# Patient Record
Sex: Female | Born: 1980 | Race: Black or African American | Hispanic: No | Marital: Married | Smoking: Never smoker
Health system: Southern US, Community
[De-identification: ages and names within clinical notes are randomized; demographics above are authoritative.]

## PROBLEM LIST (undated history)

## (undated) DIAGNOSIS — C55 Malignant neoplasm of uterus, part unspecified: Secondary | ICD-10-CM

## (undated) DIAGNOSIS — Z98811 Dental restoration status: Secondary | ICD-10-CM

## (undated) DIAGNOSIS — Z803 Family history of malignant neoplasm of breast: Secondary | ICD-10-CM

## (undated) DIAGNOSIS — S86019A Strain of unspecified Achilles tendon, initial encounter: Secondary | ICD-10-CM

## (undated) DIAGNOSIS — Z8042 Family history of malignant neoplasm of prostate: Secondary | ICD-10-CM

## (undated) HISTORY — DX: Family history of malignant neoplasm of prostate: Z80.42

## (undated) HISTORY — DX: Family history of malignant neoplasm of breast: Z80.3

## (undated) HISTORY — PX: LEEP: SHX91

## (undated) HISTORY — PX: TONSILLECTOMY AND ADENOIDECTOMY: SHX28

## (undated) HISTORY — PX: UNILATERAL SALPINGECTOMY: SHX6160

---

## 2004-06-04 ENCOUNTER — Emergency Department (HOSPITAL_COMMUNITY): Admission: EM | Admit: 2004-06-04 | Discharge: 2004-06-05 | Payer: Self-pay | Admitting: Emergency Medicine

## 2004-06-15 ENCOUNTER — Emergency Department (HOSPITAL_COMMUNITY): Admission: EM | Admit: 2004-06-15 | Discharge: 2004-06-15 | Payer: Self-pay | Admitting: Emergency Medicine

## 2004-10-22 ENCOUNTER — Emergency Department (HOSPITAL_COMMUNITY): Admission: EM | Admit: 2004-10-22 | Discharge: 2004-10-22 | Payer: Self-pay | Admitting: Emergency Medicine

## 2005-01-28 ENCOUNTER — Emergency Department (HOSPITAL_COMMUNITY): Admission: EM | Admit: 2005-01-28 | Discharge: 2005-01-29 | Payer: Self-pay | Admitting: Emergency Medicine

## 2005-03-25 ENCOUNTER — Other Ambulatory Visit: Admission: RE | Admit: 2005-03-25 | Discharge: 2005-03-25 | Payer: Self-pay | Admitting: Obstetrics and Gynecology

## 2005-04-12 ENCOUNTER — Ambulatory Visit (HOSPITAL_COMMUNITY): Admission: RE | Admit: 2005-04-12 | Discharge: 2005-04-12 | Payer: Self-pay | Admitting: Obstetrics and Gynecology

## 2005-04-12 ENCOUNTER — Emergency Department (HOSPITAL_COMMUNITY): Admission: EM | Admit: 2005-04-12 | Discharge: 2005-04-12 | Payer: Self-pay | Admitting: Emergency Medicine

## 2005-04-14 ENCOUNTER — Inpatient Hospital Stay (HOSPITAL_COMMUNITY): Admission: AD | Admit: 2005-04-14 | Discharge: 2005-04-14 | Payer: Self-pay | Admitting: Obstetrics and Gynecology

## 2005-04-15 ENCOUNTER — Ambulatory Visit (HOSPITAL_COMMUNITY): Admission: RE | Admit: 2005-04-15 | Discharge: 2005-04-15 | Payer: Self-pay | Admitting: Obstetrics and Gynecology

## 2005-08-16 ENCOUNTER — Inpatient Hospital Stay (HOSPITAL_COMMUNITY): Admission: AD | Admit: 2005-08-16 | Discharge: 2005-08-16 | Payer: Self-pay | Admitting: Obstetrics and Gynecology

## 2005-09-26 ENCOUNTER — Inpatient Hospital Stay (HOSPITAL_COMMUNITY): Admission: AD | Admit: 2005-09-26 | Discharge: 2005-09-26 | Payer: Self-pay | Admitting: Obstetrics and Gynecology

## 2005-11-09 ENCOUNTER — Encounter: Admission: RE | Admit: 2005-11-09 | Discharge: 2005-11-09 | Payer: Self-pay | Admitting: Obstetrics and Gynecology

## 2005-12-14 ENCOUNTER — Ambulatory Visit (HOSPITAL_COMMUNITY): Admission: RE | Admit: 2005-12-14 | Discharge: 2005-12-14 | Payer: Self-pay | Admitting: Obstetrics and Gynecology

## 2006-03-25 ENCOUNTER — Emergency Department (HOSPITAL_COMMUNITY): Admission: EM | Admit: 2006-03-25 | Discharge: 2006-03-25 | Payer: Self-pay | Admitting: *Deleted

## 2006-05-06 ENCOUNTER — Other Ambulatory Visit: Admission: RE | Admit: 2006-05-06 | Discharge: 2006-05-06 | Payer: Self-pay | Admitting: Obstetrics and Gynecology

## 2006-11-12 ENCOUNTER — Emergency Department (HOSPITAL_COMMUNITY): Admission: EM | Admit: 2006-11-12 | Discharge: 2006-11-12 | Payer: Self-pay | Admitting: Emergency Medicine

## 2007-01-03 ENCOUNTER — Ambulatory Visit: Payer: Self-pay | Admitting: Family Medicine

## 2007-01-03 ENCOUNTER — Other Ambulatory Visit: Admission: RE | Admit: 2007-01-03 | Discharge: 2007-01-03 | Payer: Self-pay | Admitting: Family Medicine

## 2007-01-03 LAB — CONVERTED CEMR LAB
ALT: 29 units/L (ref 0–40)
AST: 64 units/L — ABNORMAL HIGH (ref 0–37)
Albumin: 4.3 g/dL (ref 3.5–5.2)
Alkaline Phosphatase: 38 units/L — ABNORMAL LOW (ref 39–117)
BUN: 8 mg/dL (ref 6–23)
Basophils Absolute: 0 10*3/uL (ref 0.0–0.1)
Basophils Relative: 0.6 % (ref 0.0–1.0)
CO2: 28 meq/L (ref 19–32)
Calcium: 9.3 mg/dL (ref 8.4–10.5)
Chloride: 103 meq/L (ref 96–112)
Chol/HDL Ratio, serum: 2.7
Cholesterol: 164 mg/dL (ref 0–200)
Creatinine, Ser: 0.8 mg/dL (ref 0.4–1.2)
Eosinophil percent: 0.8 % (ref 0.0–5.0)
Free T4: 0.8 ng/dL (ref 0.6–1.6)
GFR calc non Af Amer: 92 mL/min
Glomerular Filtration Rate, Af Am: 112 mL/min/{1.73_m2}
Glucose, Bld: 96 mg/dL (ref 70–99)
H Pylori IgG: NEGATIVE
HCT: 37.6 % (ref 36.0–46.0)
HDL: 59.9 mg/dL (ref >39.0)
Hemoglobin: 12.2 g/dL (ref 12.0–15.0)
LDL Cholesterol: 97 mg/dL (ref 0–99)
Lymphocytes Relative: 30.1 % (ref 12.0–46.0)
MCHC: 32.4 g/dL (ref 30.0–36.0)
MCV: 86.4 fL (ref 78.0–100.0)
Monocytes Absolute: 0.3 10*3/uL (ref 0.2–0.7)
Monocytes Relative: 6.6 % (ref 3.0–11.0)
Neutro Abs: 2.8 10*3/uL (ref 1.4–7.7)
Neutrophils Relative %: 61.9 % (ref 43.0–77.0)
Platelets: 287 10*3/uL (ref 150–400)
Potassium: 3.9 meq/L (ref 3.5–5.1)
RBC: 4.36 M/uL (ref 3.87–5.11)
RDW: 13.6 % (ref 11.5–14.6)
Sodium: 140 meq/L (ref 135–145)
T3, Free: 3.5 pg/mL (ref 2.3–4.2)
TSH: 1.37 microintl units/mL (ref 0.35–5.50)
Total Bilirubin: 0.6 mg/dL (ref 0.3–1.2)
Total Protein: 7.3 g/dL (ref 6.0–8.3)
Triglyceride fasting, serum: 36 mg/dL (ref 0–149)
VLDL: 7 mg/dL (ref 0–40)
WBC: 4.5 10*3/uL (ref 4.5–10.5)

## 2007-01-06 ENCOUNTER — Encounter: Admission: RE | Admit: 2007-01-06 | Discharge: 2007-01-06 | Payer: Self-pay | Admitting: Family Medicine

## 2007-01-13 ENCOUNTER — Ambulatory Visit: Payer: Self-pay | Admitting: Family Medicine

## 2007-01-13 LAB — CONVERTED CEMR LAB
ALT: 17 units/L (ref 0–35)
AST: 17 units/L (ref 0–37)
Albumin: 4.9 g/dL (ref 3.5–5.2)
Alkaline Phosphatase: 41 units/L (ref 39–117)
Bilirubin, Direct: 0.1 mg/dL (ref 0.0–0.3)
Indirect Bilirubin: 0.4 mg/dL (ref 0.0–0.9)
Total Bilirubin: 0.5 mg/dL (ref 0.3–1.2)
Total Protein: 8 g/dL (ref 6.0–8.3)

## 2015-05-28 DIAGNOSIS — S86019A Strain of unspecified Achilles tendon, initial encounter: Secondary | ICD-10-CM

## 2015-05-28 HISTORY — DX: Strain of unspecified achilles tendon, initial encounter: S86.019A

## 2015-06-13 ENCOUNTER — Other Ambulatory Visit: Payer: Self-pay | Admitting: Orthopedic Surgery

## 2015-06-13 ENCOUNTER — Encounter (HOSPITAL_BASED_OUTPATIENT_CLINIC_OR_DEPARTMENT_OTHER): Payer: Self-pay | Admitting: *Deleted

## 2015-06-19 ENCOUNTER — Ambulatory Visit (HOSPITAL_BASED_OUTPATIENT_CLINIC_OR_DEPARTMENT_OTHER): Payer: BC Managed Care – PPO | Admitting: Anesthesiology

## 2015-06-19 ENCOUNTER — Encounter (HOSPITAL_BASED_OUTPATIENT_CLINIC_OR_DEPARTMENT_OTHER): Payer: Self-pay

## 2015-06-19 ENCOUNTER — Ambulatory Visit (HOSPITAL_BASED_OUTPATIENT_CLINIC_OR_DEPARTMENT_OTHER)
Admission: RE | Admit: 2015-06-19 | Discharge: 2015-06-19 | Disposition: A | Discharge status: Home / Self Care | Payer: BC Managed Care – PPO | Source: Ambulatory Visit | Attending: Orthopedic Surgery | Admitting: Orthopedic Surgery

## 2015-06-19 ENCOUNTER — Encounter (HOSPITAL_BASED_OUTPATIENT_CLINIC_OR_DEPARTMENT_OTHER)
Admission: RE | Disposition: A | Discharge status: Home / Self Care | Payer: Self-pay | Source: Ambulatory Visit | Attending: Orthopedic Surgery

## 2015-06-19 DIAGNOSIS — Y999 Unspecified external cause status: Secondary | ICD-10-CM | POA: Diagnosis not present

## 2015-06-19 DIAGNOSIS — Z791 Long term (current) use of non-steroidal anti-inflammatories (NSAID): Secondary | ICD-10-CM | POA: Insufficient documentation

## 2015-06-19 DIAGNOSIS — Y9379 Activity, other specified sports and athletics: Secondary | ICD-10-CM | POA: Insufficient documentation

## 2015-06-19 DIAGNOSIS — Z79899 Other long term (current) drug therapy: Secondary | ICD-10-CM | POA: Insufficient documentation

## 2015-06-19 DIAGNOSIS — X58XXXA Exposure to other specified factors, initial encounter: Secondary | ICD-10-CM | POA: Diagnosis not present

## 2015-06-19 DIAGNOSIS — Y929 Unspecified place or not applicable: Secondary | ICD-10-CM | POA: Insufficient documentation

## 2015-06-19 DIAGNOSIS — S86011A Strain of right Achilles tendon, initial encounter: Secondary | ICD-10-CM | POA: Insufficient documentation

## 2015-06-19 DIAGNOSIS — M25571 Pain in right ankle and joints of right foot: Secondary | ICD-10-CM | POA: Diagnosis present

## 2015-06-19 HISTORY — PX: ACHILLES TENDON SURGERY: SHX542

## 2015-06-19 HISTORY — DX: Strain of unspecified achilles tendon, initial encounter: S86.019A

## 2015-06-19 HISTORY — DX: Dental restoration status: Z98.811

## 2015-06-19 LAB — POCT HEMOGLOBIN-HEMACUE
HEMOGLOBIN: 13.9 g/dL (ref 12.0–15.0)
HEMOGLOBIN: 13.9 g/dL (ref 12.0–15.0)

## 2015-06-19 SURGERY — REPAIR, TENDON, ACHILLES
Anesthesia: Regional | Site: Foot | Laterality: Right

## 2015-06-19 MED ORDER — MIDAZOLAM HCL 2 MG/2ML IJ SOLN
INTRAMUSCULAR | Status: AC
  Filled 2015-06-19: qty 2

## 2015-06-19 MED ORDER — CHLORHEXIDINE GLUCONATE 4 % EX LIQD: 60.0000 mL | Freq: Once | CUTANEOUS | Status: DC

## 2015-06-19 MED ORDER — 0.9 % SODIUM CHLORIDE (POUR BTL) OPTIME
TOPICAL | Status: DC | PRN
  Administered 2015-06-19: 200 mL

## 2015-06-19 MED ORDER — SUCCINYLCHOLINE CHLORIDE 20 MG/ML IJ SOLN
INTRAMUSCULAR | Status: DC | PRN
  Administered 2015-06-19: 100 mg via INTRAVENOUS

## 2015-06-19 MED ORDER — CEFAZOLIN SODIUM-DEXTROSE 2-3 GM-% IV SOLR
INTRAVENOUS | Status: AC
  Filled 2015-06-19: qty 50

## 2015-06-19 MED ORDER — GLYCOPYRROLATE 0.2 MG/ML IJ SOLN: 0.2000 mg | Freq: Once | INTRAMUSCULAR | Status: DC | PRN

## 2015-06-19 MED ORDER — CEFAZOLIN SODIUM-DEXTROSE 2-3 GM-% IV SOLR
2.0000 g | INTRAVENOUS | Status: AC
  Administered 2015-06-19: 2 g via INTRAVENOUS

## 2015-06-19 MED ORDER — MIDAZOLAM HCL 2 MG/2ML IJ SOLN
1.0000 mg | INTRAMUSCULAR | Status: DC | PRN
  Administered 2015-06-19: 2 mg via INTRAVENOUS

## 2015-06-19 MED ORDER — FENTANYL CITRATE (PF) 100 MCG/2ML IJ SOLN
INTRAMUSCULAR | Status: AC
  Filled 2015-06-19: qty 2

## 2015-06-19 MED ORDER — OXYCODONE HCL 5 MG PO TABS: 5.0000 mg | ORAL_TABLET | Freq: Once | ORAL | Status: DC | PRN

## 2015-06-19 MED ORDER — ASPIRIN EC 325 MG PO TBEC: 325.0000 mg | DELAYED_RELEASE_TABLET | Freq: Every day | ORAL | Status: AC

## 2015-06-19 MED ORDER — SCOPOLAMINE 1 MG/3DAYS TD PT72: 1.0000 | MEDICATED_PATCH | Freq: Once | TRANSDERMAL | Status: DC | PRN

## 2015-06-19 MED ORDER — DEXAMETHASONE SODIUM PHOSPHATE 4 MG/ML IJ SOLN
INTRAMUSCULAR | Status: DC | PRN
  Administered 2015-06-19: 10 mg via INTRAVENOUS

## 2015-06-19 MED ORDER — DOCUSATE SODIUM 100 MG PO CAPS: 100.0000 mg | ORAL_CAPSULE | Freq: Two times a day (BID) | ORAL | Status: AC

## 2015-06-19 MED ORDER — OXYCODONE HCL 5 MG/5ML PO SOLN: 5.0000 mg | Freq: Once | ORAL | Status: DC | PRN

## 2015-06-19 MED ORDER — HYDROMORPHONE HCL 1 MG/ML IJ SOLN
0.2500 mg | INTRAMUSCULAR | Status: DC | PRN
  Administered 2015-06-19 (×3): 0.5 mg via INTRAVENOUS

## 2015-06-19 MED ORDER — HYDROMORPHONE HCL 1 MG/ML IJ SOLN
INTRAMUSCULAR | Status: AC
  Filled 2015-06-19: qty 1

## 2015-06-19 MED ORDER — LIDOCAINE HCL (CARDIAC) 20 MG/ML IV SOLN
INTRAVENOUS | Status: DC | PRN
  Administered 2015-06-19: 68 mg via INTRAVENOUS

## 2015-06-19 MED ORDER — LACTATED RINGERS IV SOLN
INTRAVENOUS | Status: DC
  Administered 2015-06-19 (×2): via INTRAVENOUS

## 2015-06-19 MED ORDER — MEPERIDINE HCL 25 MG/ML IJ SOLN: 6.2500 mg | INTRAMUSCULAR | Status: DC | PRN

## 2015-06-19 MED ORDER — FENTANYL CITRATE (PF) 100 MCG/2ML IJ SOLN
50.0000 ug | INTRAMUSCULAR | Status: AC | PRN
  Administered 2015-06-19: 50 ug via INTRAVENOUS
  Administered 2015-06-19: 100 ug via INTRAVENOUS
  Administered 2015-06-19: 50 ug via INTRAVENOUS

## 2015-06-19 MED ORDER — PROPOFOL 10 MG/ML IV BOLUS
INTRAVENOUS | Status: DC | PRN
  Administered 2015-06-19: 200 mg via INTRAVENOUS

## 2015-06-19 MED ORDER — OXYCODONE HCL 5 MG PO TABS: 5.0000 mg | ORAL_TABLET | ORAL | Status: AC | PRN

## 2015-06-19 MED ORDER — SENNA 8.6 MG PO TABS: 2.0000 | ORAL_TABLET | Freq: Two times a day (BID) | ORAL | Status: AC

## 2015-06-19 SURGICAL SUPPLY — 76 items
BANDAGE ESMARK 6X9 LF (GAUZE/BANDAGES/DRESSINGS) ×1 IMPLANT
BLADE AVERAGE 25MMX9MM (BLADE)
BLADE AVERAGE 25X9 (BLADE) IMPLANT
BLADE SURG 15 STRL LF DISP TIS (BLADE) ×2 IMPLANT
BLADE SURG 15 STRL SS (BLADE) ×6
BNDG CMPR 9X6 STRL LF SNTH (GAUZE/BANDAGES/DRESSINGS) ×1
BNDG COHESIVE 4X5 TAN STRL (GAUZE/BANDAGES/DRESSINGS) ×3 IMPLANT
BNDG COHESIVE 6X5 TAN STRL LF (GAUZE/BANDAGES/DRESSINGS) ×3 IMPLANT
BNDG ESMARK 6X9 LF (GAUZE/BANDAGES/DRESSINGS) ×3
CANISTER SUCT 1200ML W/VALVE (MISCELLANEOUS) ×3 IMPLANT
CHLORAPREP W/TINT 26ML (MISCELLANEOUS) ×3 IMPLANT
COVER BACK TABLE 60X90IN (DRAPES) ×3 IMPLANT
CUFF TOURNIQUET SINGLE 34IN LL (TOURNIQUET CUFF) ×3 IMPLANT
DRAPE EXTREMITY T 121X128X90 (DRAPE) ×3 IMPLANT
DRAPE OEC MINIVIEW 54X84 (DRAPES) IMPLANT
DRAPE U-SHAPE 47X51 STRL (DRAPES) ×3 IMPLANT
DRSG ADAPTIC 3X8 NADH LF (GAUZE/BANDAGES/DRESSINGS) IMPLANT
DRSG EMULSION OIL 3X3 NADH (GAUZE/BANDAGES/DRESSINGS) IMPLANT
DRSG MEPITEL 4X7.2 (GAUZE/BANDAGES/DRESSINGS) ×2 IMPLANT
DRSG PAD ABDOMINAL 8X10 ST (GAUZE/BANDAGES/DRESSINGS) ×6 IMPLANT
ELECT REM PT RETURN 9FT ADLT (ELECTROSURGICAL) ×3
ELECTRODE REM PT RTRN 9FT ADLT (ELECTROSURGICAL) ×1 IMPLANT
GAUZE SPONGE 4X4 12PLY STRL (GAUZE/BANDAGES/DRESSINGS) ×3 IMPLANT
GLOVE BIO SURGEON STRL SZ8 (GLOVE) ×3 IMPLANT
GLOVE BIOGEL M STRL SZ7.5 (GLOVE) ×2 IMPLANT
GLOVE BIOGEL PI IND STRL 8 (GLOVE) ×2 IMPLANT
GLOVE BIOGEL PI INDICATOR 8 (GLOVE) ×2
GLOVE ECLIPSE 7.5 STRL STRAW (GLOVE) ×3 IMPLANT
GLOVE EXAM NITRILE MD LF STRL (GLOVE) ×2 IMPLANT
GOWN STRL REUS W/ TWL LRG LVL3 (GOWN DISPOSABLE) ×1 IMPLANT
GOWN STRL REUS W/ TWL XL LVL3 (GOWN DISPOSABLE) ×2 IMPLANT
GOWN STRL REUS W/TWL LRG LVL3 (GOWN DISPOSABLE)
GOWN STRL REUS W/TWL XL LVL3 (GOWN DISPOSABLE) ×9
KIT BIO-TENODESIS 3X8 DISP (MISCELLANEOUS)
KIT INSRT BABSR STRL DISP BTN (MISCELLANEOUS) IMPLANT
NDL SAFETY ECLIPSE 18X1.5 (NEEDLE) IMPLANT
NDL SUT 6 .5 CRC .975X.05 MAYO (NEEDLE) IMPLANT
NEEDLE HYPO 18GX1.5 SHARP (NEEDLE)
NEEDLE HYPO 22GX1.5 SAFETY (NEEDLE) IMPLANT
NEEDLE MAYO TAPER (NEEDLE)
PACK BASIN DAY SURGERY FS (CUSTOM PROCEDURE TRAY) ×3 IMPLANT
PAD CAST 4YDX4 CTTN HI CHSV (CAST SUPPLIES) ×1 IMPLANT
PADDING CAST ABS 4INX4YD NS (CAST SUPPLIES)
PADDING CAST ABS COTTON 4X4 ST (CAST SUPPLIES) IMPLANT
PADDING CAST COTTON 4X4 STRL (CAST SUPPLIES) ×3
PADDING CAST COTTON 6X4 STRL (CAST SUPPLIES) ×3 IMPLANT
PENCIL BUTTON HOLSTER BLD 10FT (ELECTRODE) ×3 IMPLANT
SANITIZER HAND PURELL 535ML FO (MISCELLANEOUS) ×3 IMPLANT
SHEET MEDIUM DRAPE 40X70 STRL (DRAPES) ×3 IMPLANT
SLEEVE SCD COMPRESS KNEE MED (MISCELLANEOUS) ×3 IMPLANT
SPLINT FAST PLASTER 5X30 (CAST SUPPLIES) ×40
SPLINT PLASTER CAST FAST 5X30 (CAST SUPPLIES) ×20 IMPLANT
SPONGE LAP 18X18 X RAY DECT (DISPOSABLE) ×3 IMPLANT
STAPLER VISISTAT 35W (STAPLE) IMPLANT
STOCKINETTE 6  STRL (DRAPES) ×2
STOCKINETTE 6 STRL (DRAPES) ×1 IMPLANT
SUCTION FRAZIER TIP 10 FR DISP (SUCTIONS) ×2 IMPLANT
SUT 2 FIBERLOOP 20 STRT BLUE (SUTURE)
SUT ETHIBOND 2 OS 4 DA (SUTURE) IMPLANT
SUT ETHILON 3 0 PS 1 (SUTURE) ×3 IMPLANT
SUT FIBERWIRE #2 38 T-5 BLUE (SUTURE)
SUT MNCRL AB 3-0 PS2 18 (SUTURE) ×7 IMPLANT
SUT VIC AB 0 SH 27 (SUTURE) ×2 IMPLANT
SUT VIC AB 1 CT1 27 (SUTURE) ×12
SUT VIC AB 1 CT1 27XBRD ANBCTR (SUTURE) IMPLANT
SUT VIC AB 2-0 SH 27 (SUTURE)
SUT VIC AB 2-0 SH 27XBRD (SUTURE) IMPLANT
SUT VICRYL 4-0 PS2 18IN ABS (SUTURE) IMPLANT
SUTURE 2 FIBERLOOP 20 STRT BLU (SUTURE) IMPLANT
SUTURE FIBERWR #2 38 T-5 BLUE (SUTURE) IMPLANT
SYR BULB 3OZ (MISCELLANEOUS) ×3 IMPLANT
SYR CONTROL 10ML LL (SYRINGE) IMPLANT
TOWEL OR 17X24 6PK STRL BLUE (TOWEL DISPOSABLE) ×5 IMPLANT
TUBE CONNECTING 20'X1/4 (TUBING) ×1
TUBE CONNECTING 20X1/4 (TUBING) ×2 IMPLANT
UNDERPAD 30X30 (UNDERPADS AND DIAPERS) ×3 IMPLANT

## 2015-06-19 NOTE — Anesthesia Postprocedure Evaluation (Signed)
  Anesthesia Post-op Note  Patient: Diamond Mccoy  Procedure(s) Performed: Procedure(s): RIGHT ACHILLES TENDON REPAIR (Right)  Patient Location: PACU  Anesthesia Type:GA combined with regional for post-op pain  Level of Consciousness: awake, alert , oriented and patient cooperative  Airway and Oxygen Therapy: Patient Spontanous Breathing  Post-op Pain: mild  Post-op Assessment: Post-op Vital signs reviewed, Patient's Cardiovascular Status Stable, Respiratory Function Stable, Patent Airway, No signs of Nausea or vomiting, Adequate PO intake and Pain level controlled     RLE Motor Response: Non-purposeful movement RLE Sensation: Numbness, Decreased      Post-op Vital Signs: Reviewed and stable  Last Vitals:  Filed Vitals:   06/19/15 1600  BP:   Pulse: 67  Temp:   Resp: 12    Complications: No apparent anesthesia complications

## 2015-06-19 NOTE — Progress Notes (Signed)
Assisted Dr. Al Corpus with right, ultrasound guided, popliteal block. Side rails up, monitors on throughout procedure. See vital signs in flow sheet. Tolerated Procedure well.

## 2015-06-19 NOTE — Discharge Instructions (Addendum)
Diamond Hewitt, MD °[facility] ° °Please read the following information regarding your care after surgery. ° °Medications  °You only need a prescription for the narcotic pain medicine (ex. oxycodone, Percocet, Norco).  All of the other medicines listed below are available over the counter. °X acetominophen (Tylenol) 650 mg every 4-6 hours as you need for minor pain °X oxycodone as prescribed for moderate to severe pain °?  ° °Narcotic pain medicine (ex. oxycodone, Percocet, Vicodin) will cause constipation.  To prevent this problem, take the following medicines while you are taking any pain medicine. °X docusate sodium (Colace) 100 mg twice a day X senna (Senokot) 2 tablets twice a day ° °X To help prevent blood clots, take an aspirin (325 mg) once a day for a month after surgery.  You should also get up every hour while you are awake to move around.   ° °Weight Bearing °X Do not bear any weight on the operated leg or foot. ° °Cast / Splint / Dressing °X Keep your splint or cast clean and dry.  Don’t put anything (coat hanger, pencil, etc) down inside of it.  If it gets damp, use a hair dryer on the cool setting to dry it.  If it gets soaked, call the office to schedule an appointment for a cast change. ° ° °After your dressing, cast or splint is removed; you may shower, but do not soak or scrub the wound.  Allow the water to run over it, and then gently pat it dry. ° °Swelling °It is normal for you to have swelling where you had surgery.  To reduce swelling and pain, keep your toes above your nose for at least 3 days after surgery.  It may be necessary to keep your foot or leg elevated for several weeks.  If it hurts, it should be elevated. ° °Follow Up °Call my office at [phone] when you are discharged from the hospital or surgery center to schedule an appointment to be seen two weeks after surgery. ° °Call my office at [phone] if you develop a fever >101.5° F, nausea, vomiting, bleeding from  the surgical site or severe pain.   ° °Regional Anesthesia Blocks ° °1. Numbness or the inability to move the "blocked" extremity may last from 3-48 hours after placement. The length of time depends on the medication injected and your individual response to the medication. If the numbness is not going away after 48 hours, call your surgeon. ° °2. The extremity that is blocked will need to be protected until the numbness is gone and the  Strength has returned. Because you cannot feel it, you will need to take extra care to avoid injury. Because it may be weak, you may have difficulty moving it or using it. You may not know what position it is in without looking at it while the block is in effect. ° °3. For blocks in the legs and feet, returning to weight bearing and walking needs to be done carefully. You will need to wait until the numbness is entirely gone and the strength has returned. You should be able to move your leg and foot normally before you try and bear weight or walk. You will need someone to be with you when you first try to ensure you do not fall and possibly risk injury. ° °4. Bruising and tenderness at the needle site are common side effects and will resolve in a few days. ° °5. Persistent numbness or new problems with movement   should be communicated to the surgeon or the [facility] ([phone])/ [facility] ([phone]). ° °Post Anesthesia Home Care Instructions ° °Activity: °Get plenty of rest for the remainder of the day. A responsible adult should stay with you for 24 hours following the procedure.  °For the next 24 hours, DO NOT: °-Drive a car °-Operate machinery °-Drink alcoholic beverages °-Take any medication unless instructed by your physician °-Make any legal decisions or sign important papers. ° °Meals: °Start with liquid foods such as gelatin or soup. Progress to regular foods as tolerated. Avoid greasy, spicy, heavy foods. If nausea and/or vomiting occur,  drink only clear liquids until the nausea and/or vomiting subsides. Call your physician if vomiting continues. ° °Special Instructions/Symptoms: °Your throat may feel dry or sore from the anesthesia or the breathing tube placed in your throat during surgery. If this causes discomfort, gargle with warm salt water. The discomfort should disappear within 24 hours. ° °If you had a scopolamine patch placed behind your ear for the management of post- operative nausea and/or vomiting: ° °1. The medication in the patch is effective for 72 hours, after which it should be removed.  Wrap patch in a tissue and discard in the trash. Wash hands thoroughly with soap and water. °2. You may remove the patch earlier than 72 hours if you experience unpleasant side effects which may include dry mouth, dizziness or visual disturbances. °3. Avoid touching the patch. Wash your hands with soap and water after contact with the patch. °  ° °

## 2015-06-19 NOTE — Anesthesia Procedure Notes (Signed)
Procedure Name: Intubation Performed by: Terrance Mass Pre-anesthesia Checklist: Patient identified, Timeout performed, Emergency Drugs available, Suction available and Patient being monitored Patient Re-evaluated:Patient Re-evaluated prior to inductionOxygen Delivery Method: Circle system utilized Preoxygenation: Pre-oxygenation with 100% oxygen Intubation Type: IV induction Ventilation: Mask ventilation without difficulty Laryngoscope Size: Martino and 2 Grade View: Grade I Tube type: Oral Tube size: 7.0 mm Number of attempts: 1 Airway Equipment and Method: Stylet Placement Confirmation: ETT inserted through vocal cords under direct vision,  breath sounds checked- equal and bilateral and positive ETCO2 Secured at: 22 cm Dental Injury: Teeth and Oropharynx as per pre-operative assessment

## 2015-06-19 NOTE — Brief Op Note (Signed)
06/19/2015  3:41 PM  PATIENT:  Diamond Mccoy  34 y.o. female  PRE-OPERATIVE DIAGNOSIS:  RIGHT ACHILLES TENDON RUPTURE  POST-OPERATIVE DIAGNOSIS:  RIGHT ACHILLES TENDON RUPTURE  Procedure(s): RIGHT ACHILLES TENDON REPAIR  SURGEON:  Wylene Simmer, MD  ASSISTANT: Mechele Claude, PA-C  ANESTHESIA:   General, regional  EBL:  minimal   TOURNIQUET:   Total Tourniquet Time Documented: Thigh (Right) - 53 minutes Total: Thigh (Right) - 53 minutes  COMPLICATIONS:  None apparent  DISPOSITION:  Extubated, awake and stable to recovery.  DICTATION ID:  [id number]

## 2015-06-19 NOTE — Op Note (Signed)
Diamond Mccoy, Diamond Mccoy NO.:  [id number]  MEDICAL RECORD NO.:  [id number]  LOCATION:                                 FACILITY:  PHYSICIAN:  Wylene Simmer, MD             DATE OF BIRTH:  DATE OF PROCEDURE:  06/19/2015 DATE OF DISCHARGE:                              OPERATIVE REPORT   PREOPERATIVE DIAGNOSIS:  Right Achilles tendon rupture.  POSTOPERATIVE DIAGNOSIS:  Right Achilles tendon rupture.  PROCEDURE:  Right Achilles tendon repair.  SURGEON:  Wylene Simmer, MD.  ASSISTANT:  Mechele Claude, PA-C.  ANESTHESIA:  General, regional.  ESTIMATED BLOOD LOSS:  Minimal.  TOURNIQUET TIME:  53 minutes, 250 mmHg.  COMPLICATIONS:  None apparent.  DISPOSITION:  Extubated, awake, and stable to recovery.  INDICATIONS FOR PROCEDURE:  The patient is a 34 year old woman, who injured her right leg, playing sports approximately 2 weeks ago.  She was found to have a positive Thompson test.  She was diagnosed with Achilles tendon rupture.  We had a long discussion about treatment options for this condition.  We discussed the risks and benefits of both operative and non-operative treatment.  She understands the risks and benefits, the alternative treatment options, and would like to proceed with surgical treatment.  She specifically understands risks of bleeding, infection, nerve damage, blood clots, need for additional surgery, continued pain, amputation, and death.  PROCEDURE IN DETAIL:  After preoperative consent was obtained and the correct operative site was identified, the patient was brought to the operating room, supine on a stretcher.  General anesthesia was induced. Preoperative antibiotics were administered.  Surgical time-out was taken.  The right lower extremity was exsanguinated and a thigh tourniquet was inflated to 250 mmHg.  The patient was then turned into the prone position on the operating table with all bony prominences padded well.  The right lower  extremity was prepped and draped in standard sterile fashion.  A longitudinal incision was then made over the palpable defect in the Achilles.  Sharp dissection was carried down through the skin and subcutaneous tissue and paratenon.  The hematoma was then irrigated.  The rupture was identified and the tendon was noted to be complete.  The deep fascia over the flexor hallucis longus tendon was released longitudinally to allow easier closure of the peritenon after the repair.  The ends of the tendon were freshened with scissors. A #1 Vicryl sutures were placed in the ends of the tendon using the [place] technique.  This created 2 pairs of suture at each end of the tendon rupture.  The ankle was then plantar flexed and the sutures were tied to each other in pair wise fashion repairing the tendon and restoring the appropriate length.  The [place] test was noted to be normal at this point.  The tendon repair was then oversewn with 3-0 Monocryl using the [place] technique.  The wound was again irrigated copiously.  The paratenon was repaired with inverted simple sutures of 3-0 Monocryl.  Subcutaneous tissues were approximated with inverted simple sutures of 3-0 Monocryl.  The skin incision was closed with horizontal mattress sutures of 3-0  nylon.  Sterile dressings were applied followed by well-padded short-leg splint.  Tourniquet was released after application of the dressings at 53 minutes.  The patient was awakened from anesthesia and transported to recovery room in stable condition.  FOLLOWUP PLAN:  The patient will be nonweightbearing on the right lower extremity.  She will take aspirin for DVT prophylaxis.  She will follow up with me in 2 weeks for suture removal and conversion to a CAM walker boot with 2 heel lifts.  Joseph Pierini, PA-C was present and scrubbed for the duration of the case.  His assistance was essential in positioning the patient, prepping and draping,  performing the operation, closing and dressing the wounds, and applying the splint.     Wylene Simmer, MD     JH/MEDQ  D:  06/19/2015  T:  06/19/2015  Job:  [id number]

## 2015-06-19 NOTE — H&P (Signed)
Diamond Mccoy is an 34 y.o. female.   Chief Complaint: right ankle pain HPI: 34 y/o female without PMH ruptured her right achilles tendon last week.  She presents now for surgical repair.  Past Medical History  Diagnosis Date  . Achilles tendon rupture 05/2015    right  . Dental crowns present     Past Surgical History  Procedure Laterality Date  . Leep    . Tonsillectomy and adenoidectomy    . Unilateral salpingectomy Left     History reviewed. No pertinent family history. Social History:  reports that she has never smoked. She has never used smokeless tobacco. She reports that she drinks alcohol. She reports that she does not use illicit drugs.  Allergies: No Known Allergies  Medications Prior to Admission  Medication Sig Dispense Refill  . Biotin 1000 MCG tablet Take 1,000 mcg by mouth 3 (three) times daily.    . naproxen (NAPROSYN) 250 MG tablet Take by mouth 2 (two) times daily with a meal.      Results for orders placed or performed during the hospital encounter of 06-20-15 (from the past 48 hour(s))  Hemoglobin-hemacue, POC     Status: None   Collection Time: 2015-06-20  1:07 PM  Result Value Ref Range   Hemoglobin 13.9 12.0 - 15.0 g/dL  Hemoglobin-hemacue, POC     Status: None   Collection Time: 20-Jun-2015  1:08 PM  Result Value Ref Range   Hemoglobin 13.9 12.0 - 15.0 g/dL   No results found.  ROS  No recent f/c/n/v/wt loss  Blood pressure 132/71, pulse 64, temperature 98.2 F (36.8 C), temperature source Oral, resp. rate 20, height 5\' 4"  (1.626 m), weight 82.101 kg (181 lb), last menstrual period 06/11/2015, SpO2 100 %. Physical Exam  wn wd woman in nad.  A and O x 4.  Mood and affect normal.  EOMI.  resp unlabored.  R ankle with healthy skin and slight swelling.  5/5 strength in DF.  No lymphadenopathy.  Sens to LT intact about the foot and ankle.  + Thompson test.  Palpable defect at the tendon.  Assessment/Plan R achilles tendon rupture - to OR for surgical  repair.  The risks and benefits of the alternative treatment options have been discussed in detail.  The patient wishes to proceed with surgery and specifically understands risks of bleeding, infection, nerve damage, blood clots, need for additional surgery, amputation and death.   Wylene Simmer 2015-06-20, 1:19 PM

## 2015-06-19 NOTE — Anesthesia Preprocedure Evaluation (Signed)
Anesthesia Evaluation  Patient identified by MRN, date of birth, ID band Patient awake    Reviewed: Allergy & Precautions, NPO status , Patient's Chart, lab work & pertinent test results  Airway Mallampati: I  TM Distance: >3 FB Neck ROM: Full    Dental  (+) Teeth Intact, Dental Advisory Given   Pulmonary    breath sounds clear to auscultation       Cardiovascular  Rhythm:Regular Rate:Normal     Neuro/Psych    GI/Hepatic   Endo/Other    Renal/GU      Musculoskeletal   Abdominal   Peds  Hematology   Anesthesia Other Findings   Reproductive/Obstetrics                            Anesthesia Physical Anesthesia Plan  ASA: I  Anesthesia Plan: General and Regional   Post-op Pain Management:    Induction: Intravenous  Airway Management Planned: LMA  Additional Equipment:   Intra-op Plan:   Post-operative Plan: Extubation in OR  Informed Consent: I have reviewed the patients History and Physical, chart, labs and discussed the procedure including the risks, benefits and alternatives for the proposed anesthesia with the patient or authorized representative who has indicated his/her understanding and acceptance.   Dental advisory given  Plan Discussed with: CRNA, Anesthesiologist and Surgeon  Anesthesia Plan Comments:         Anesthesia Quick Evaluation  

## 2015-06-19 NOTE — Transfer of Care (Signed)
Immediate Anesthesia Transfer of Care Note  Patient: Diamond Mccoy  Procedure(s) Performed: Procedure(s): RIGHT ACHILLES TENDON REPAIR (Right)  Patient Location: PACU  Anesthesia Type:General and GA combined with regional for post-op pain  Level of Consciousness: awake  Airway & Oxygen Therapy: Patient Spontanous Breathing and Patient connected to face mask oxygen  Post-op Assessment: Report given to RN and Post -op Vital signs reviewed and stable  Post vital signs: Reviewed and stable  Last Vitals:  Filed Vitals:   06/19/15 1521  BP:   Pulse: 89  Temp: 37.1 C  Resp: 11    Complications: No apparent anesthesia complications

## 2015-06-20 ENCOUNTER — Encounter (HOSPITAL_BASED_OUTPATIENT_CLINIC_OR_DEPARTMENT_OTHER): Payer: Self-pay | Admitting: Orthopedic Surgery

## 2021-05-23 ENCOUNTER — Emergency Department (HOSPITAL_COMMUNITY)
Admission: EM | Admit: 2021-05-23 | Discharge: 2021-05-23 | Disposition: A | Discharge status: Home / Self Care | Payer: BC Managed Care – PPO | Attending: Emergency Medicine | Admitting: Emergency Medicine

## 2021-05-23 DIAGNOSIS — S79921A Unspecified injury of right thigh, initial encounter: Secondary | ICD-10-CM | POA: Diagnosis present

## 2021-05-23 DIAGNOSIS — Z23 Encounter for immunization: Secondary | ICD-10-CM | POA: Insufficient documentation

## 2021-05-23 DIAGNOSIS — X788XXA Intentional self-harm by other sharp object, initial encounter: Secondary | ICD-10-CM | POA: Diagnosis not present

## 2021-05-23 DIAGNOSIS — Z7982 Long term (current) use of aspirin: Secondary | ICD-10-CM | POA: Insufficient documentation

## 2021-05-23 DIAGNOSIS — S71111A Laceration without foreign body, right thigh, initial encounter: Secondary | ICD-10-CM | POA: Diagnosis not present

## 2021-05-23 DIAGNOSIS — Z7289 Other problems related to lifestyle: Secondary | ICD-10-CM

## 2021-05-23 MED ORDER — TETANUS-DIPHTH-ACELL PERTUSSIS 5-2.5-18.5 LF-MCG/0.5 IM SUSY
0.5000 mL | PREFILLED_SYRINGE | Freq: Once | INTRAMUSCULAR | Status: AC
  Administered 2021-05-23: 0.5 mL via INTRAMUSCULAR
  Filled 2021-05-23: qty 0.5
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
## 2021-05-23 NOTE — Discharge Instructions (Addendum)
Please follow up with your therapist as soon as possible. We are going into a holiday weekend and close follow up may not be possible. If you feel like cutting again, become more depressed or start having thoughts of wanting to kill yourself, return to the ED for further support.

## 2022-02-11 ENCOUNTER — Emergency Department (HOSPITAL_BASED_OUTPATIENT_CLINIC_OR_DEPARTMENT_OTHER): Payer: BC Managed Care – PPO | Admitting: Radiology

## 2022-02-11 ENCOUNTER — Emergency Department (HOSPITAL_BASED_OUTPATIENT_CLINIC_OR_DEPARTMENT_OTHER)
Admission: EM | Admit: 2022-02-11 | Discharge: 2022-02-11 | Disposition: A | Discharge status: Home / Self Care | Payer: BC Managed Care – PPO | Source: Home / Self Care | Attending: Emergency Medicine | Admitting: Emergency Medicine

## 2022-02-11 ENCOUNTER — Other Ambulatory Visit: Payer: Self-pay

## 2022-02-11 ENCOUNTER — Encounter (HOSPITAL_COMMUNITY): Payer: Self-pay

## 2022-02-11 ENCOUNTER — Emergency Department (HOSPITAL_COMMUNITY)
Admission: EM | Admit: 2022-02-11 | Discharge: 2022-02-11 | Discharge status: Against Medical Advice | Payer: BC Managed Care – PPO | Attending: Emergency Medicine | Admitting: Emergency Medicine

## 2022-02-11 ENCOUNTER — Emergency Department (HOSPITAL_BASED_OUTPATIENT_CLINIC_OR_DEPARTMENT_OTHER): Payer: BC Managed Care – PPO

## 2022-02-11 ENCOUNTER — Emergency Department (HOSPITAL_COMMUNITY): Payer: BC Managed Care – PPO

## 2022-02-11 DIAGNOSIS — Z20822 Contact with and (suspected) exposure to covid-19: Secondary | ICD-10-CM | POA: Insufficient documentation

## 2022-02-11 DIAGNOSIS — R002 Palpitations: Secondary | ICD-10-CM | POA: Insufficient documentation

## 2022-02-11 DIAGNOSIS — N3289 Other specified disorders of bladder: Secondary | ICD-10-CM | POA: Insufficient documentation

## 2022-02-11 DIAGNOSIS — R0789 Other chest pain: Secondary | ICD-10-CM | POA: Insufficient documentation

## 2022-02-11 DIAGNOSIS — R079 Chest pain, unspecified: Secondary | ICD-10-CM

## 2022-02-11 DIAGNOSIS — Z5329 Procedure and treatment not carried out because of patient's decision for other reasons: Secondary | ICD-10-CM | POA: Diagnosis not present

## 2022-02-11 DIAGNOSIS — Z7982 Long term (current) use of aspirin: Secondary | ICD-10-CM | POA: Insufficient documentation

## 2022-02-11 DIAGNOSIS — R42 Dizziness and giddiness: Secondary | ICD-10-CM | POA: Insufficient documentation

## 2022-02-11 LAB — CBC
HCT: 42.9 % (ref 36.0–46.0)
Hemoglobin: 14.1 g/dL (ref 12.0–15.0)
MCH: 27 pg (ref 26.0–34.0)
MCHC: 32.9 g/dL (ref 30.0–36.0)
MCV: 82.2 fL (ref 80.0–100.0)
Platelets: 327 10*3/uL (ref 150–400)
RBC: 5.22 MIL/uL — ABNORMAL HIGH (ref 3.87–5.11)
RDW: 15.1 % (ref 11.5–15.5)
WBC: 8.6 10*3/uL (ref 4.0–10.5)
nRBC: 0 % (ref 0.0–0.2)

## 2022-02-11 LAB — BASIC METABOLIC PANEL
Anion gap: 8 (ref 5–15)
BUN: 23 mg/dL — ABNORMAL HIGH (ref 6–20)
CO2: 23 mmol/L (ref 22–32)
Calcium: 9.7 mg/dL (ref 8.9–10.3)
Chloride: 105 mmol/L (ref 98–111)
Creatinine, Ser: 0.79 mg/dL (ref 0.44–1.00)
GFR, Estimated: >60 mL/min (ref >60)
Glucose, Bld: 111 mg/dL — ABNORMAL HIGH (ref 70–99)
Potassium: 4.1 mmol/L (ref 3.5–5.1)
Sodium: 136 mmol/L (ref 135–145)

## 2022-02-11 LAB — RESP PANEL BY RT-PCR (FLU A&B, COVID) ARPGX2
Influenza A by PCR: NEGATIVE
Influenza B by PCR: NEGATIVE
SARS Coronavirus 2 by RT PCR: NEGATIVE

## 2022-02-11 LAB — TROPONIN I (HIGH SENSITIVITY)
Troponin I (High Sensitivity): 3 ng/L (ref <18)
Troponin I (High Sensitivity): 3 ng/L (ref <18)

## 2022-02-11 LAB — D-DIMER, QUANTITATIVE: D-Dimer, Quant: <0.27 ug/mL-FEU (ref 0.00–0.50)

## 2022-02-11 MED ORDER — IOHEXOL 350 MG/ML SOLN
100.0000 mL | Freq: Once | INTRAVENOUS | Status: AC | PRN
  Administered 2022-02-11: 100 mL via INTRAVENOUS

## 2022-02-11 MED ORDER — ASPIRIN 81 MG PO CHEW
324.0000 mg | CHEWABLE_TABLET | Freq: Once | ORAL | Status: AC
  Administered 2022-02-11: 324 mg via ORAL
  Filled 2022-02-11: qty 4

## 2022-02-11 NOTE — ED Triage Notes (Signed)
Pt presents with c/o chest pain that started last night, presents as heaviness. Pt reports that she had shoulder pain on Monday night, no injury to the shoulder. Pt appeared to be off balance when walking to room.

## 2022-02-11 NOTE — ED Notes (Signed)
Pt states still having chest pain

## 2022-02-11 NOTE — ED Provider Notes (Signed)
[facility] EMERGENCY DEPT Provider Note   CSN: [id number] Arrival date & time: 02/11/22  2048     History  Chief Complaint  Patient presents with   Chest Pain   Dizziness    Rudean Icenhour Gronewold is a 41 y.o. female.  41 year old female presents with chest pain which began yesterday evening.  Describes it as pressure sensation.  States it lasted for several minutes and then resolved on its own.  Reoccurred today while working.  Symptoms lasted for seconds at a time and wax and wane and were associate with her becoming dizzy.  Denies any syncope or near syncope.  Pain did shoot down both legs but denies any ataxia.  No severe headaches.  Went to [facility] and had a medical screening exam.  Those results were reviewed and patient did not wait for them.  Denies any fever, cough, congestion.  No URI symptoms.      Home Medications Prior to Admission medications   Medication Sig Start Date End Date Taking? Authorizing Provider  aspirin EC 325 MG tablet Take 1 tablet (325 mg total) by mouth daily. 06/19/15   Corky Sing, PA-C  Biotin 1000 MCG tablet Take 1,000 mcg by mouth 3 (three) times daily.    [provider]  docusate sodium (COLACE) 100 MG capsule Take 1 capsule (100 mg total) by mouth 2 (two) times daily. While taking narcotic pain medicine. 06/19/15   Corky Sing, PA-C  oxyCODONE (ROXICODONE) 5 MG immediate release tablet Take 1-2 tablets (5-10 mg total) by mouth every 4 (four) hours as needed for moderate pain or severe pain. 06/19/15   Corky Sing, PA-C  senna (SENOKOT) 8.6 MG TABS tablet Take 2 tablets (17.2 mg total) by mouth 2 (two) times daily. 06/19/15   Corky Sing, PA-C      Allergies    Shrimp (diagnostic)    Review of Systems   Review of Systems  All other systems reviewed and are negative.  Physical Exam Updated Vital Signs BP (!) 146/95    Pulse 80    Temp 98.4 F (36.9 C) (Oral)    Resp 19    Ht 1.6 m (5'  3")    Wt 90.7 kg    LMP 06/11/2015 (Exact Date)    SpO2 100%    BMI 35.43 kg/m  Physical Exam Vitals and nursing note reviewed.  Constitutional:      General: She is not in acute distress.    Appearance: Normal appearance. She is well-developed. She is not toxic-appearing.  HENT:     Head: Normocephalic and atraumatic.  Eyes:     General: Lids are normal.     Conjunctiva/sclera: Conjunctivae normal.     Pupils: Pupils are equal, round, and reactive to light.  Neck:     Thyroid: No thyroid mass.     Trachea: No tracheal deviation.  Cardiovascular:     Rate and Rhythm: Normal rate and regular rhythm.     Heart sounds: Normal heart sounds. No murmur heard.   No gallop.  Pulmonary:     Effort: Pulmonary effort is normal. No respiratory distress.     Breath sounds: Normal breath sounds. No stridor. No decreased breath sounds, wheezing, rhonchi or rales.  Abdominal:     General: There is no distension.     Palpations: Abdomen is soft.     Tenderness: There is no abdominal tenderness. There is no rebound.  Musculoskeletal:  General: No tenderness. Normal range of motion.     Cervical back: Normal range of motion and neck supple.  Skin:    General: Skin is warm and dry.     Findings: No abrasion or rash.  Neurological:     Mental Status: She is alert and oriented to person, place, and time. Mental status is at baseline.     GCS: GCS eye subscore is 4. GCS verbal subscore is 5. GCS motor subscore is 6.     Cranial Nerves: No cranial nerve deficit.     Sensory: No sensory deficit.     Motor: Motor function is intact.  Psychiatric:        Attention and Perception: Attention normal.        Speech: Speech normal.        Behavior: Behavior normal.    ED Results / Procedures / Treatments   Labs (all labs ordered are listed, but only abnormal results are displayed) Labs Reviewed  RESP PANEL BY RT-PCR (FLU A&B, COVID) ARPGX2  TROPONIN I (HIGH SENSITIVITY)    EKG EKG  Interpretation  Date/Time:  Thursday February 11 2022 20:53:37 EST Ventricular Rate:  77 PR Interval:  168 QRS Duration: 89 QT Interval:  389 QTC Calculation: 441 R Axis:   51 Text Interpretation: Sinus rhythm ST elev, probable normal early repol pattern No significant change since last tracing Confirmed by Lacretia Leigh ([id number]) on 02/11/2022 9:05:41 PM  Radiology DG Chest 2 View  Result Date: 02/11/2022 CLINICAL DATA:  Chest pain EXAM: CHEST - 2 VIEW COMPARISON:  04/17/2013 FINDINGS: The heart size and mediastinal contours are within normal limits. Both lungs are clear. The visualized skeletal structures are unremarkable. IMPRESSION: No active cardiopulmonary disease. Electronically Signed   By: Franchot Gallo M.D.   On: 02/11/2022 14:57    Procedures Procedures    Medications Ordered in ED Medications - No data to display  ED Course/ Medical Decision Making/ A&P                           Medical Decision Making Amount and/or Complexity of Data Reviewed Radiology: ordered.  Risk Prescription drug management.   Patient here complaining of dizziness palpitations almost passing out.  Also has had chest discomfort.  ACS versus PE versus dissection.  EKG per my interpretation shows no acute ischemic changes.  Patient is at pain for over 6 hours and troponin is negative.  Therefore low suspicion for ACS.  CT angio of chest abdomen pelvis negative for dissection or PE.  Had long discussion with patient and her wife.  Patient has been a great deal of stress recently.  Feel that this could be some of her current symptoms given reassuring evaluation here.  Patient to follow-up with her doctor        Final Clinical Impression(s) / ED Diagnoses Final diagnoses:  None    Rx / DC Orders ED Discharge Orders     None         Lacretia Leigh, MD 02/11/22 2239

## 2022-02-11 NOTE — ED Triage Notes (Signed)
Pain in left shoulder on Monday, last night worse, tonight chest pain today with pains shooting down both legs today, nausea, dizziness, syncopal episode while in [place] waiting room this evening. SOB last night and this evening.

## 2022-02-11 NOTE — ED Notes (Signed)
Unable to obtain BMP/Trop and CBC at this time.

## 2022-02-11 NOTE — ED Provider Triage Note (Signed)
Emergency Medicine Provider Triage Evaluation Note  Diamond Mccoy , a 41 y.o. female  was evaluated in triage.  Reports history of endometrial cancer currently in remission uses estradiol patches daily no other daily medication use or medical conditions.  Chest pain onset last night sudden, central chest pressure moderate intensity, gradually resolved without intervention.  Pain has returned intermittently since that time most recent pain was just after ED arrival.  Patient currently is pain-free.  Associated with shortness of breath  Review of Systems  Positive: Chest pain, shortness of breath Negative: Fever, chills, fall, injury, abdominal pain, nausea/vomiting, diarrhea, diaphoresis, extremity swelling/color change or any additional concerns.  Physical Exam  BP (!) 151/95 (BP Location: Left Arm)    Pulse 86    Temp 98.4 F (36.9 C) (Oral)    Resp 18    LMP 06/11/2015 (Exact Date)    SpO2 95%  Gen:   Awake, no distress  \ Resp:  Normal effort lungs clear MSK:   Moves extremities without difficulty, no extremity swelling/edema or color change Other:  Heart regular rate and rhythm  Medical Decision Making  Medically screening exam initiated at 3:18 PM.  Appropriate orders placed.  Diamond Mccoy was informed that the remainder of the evaluation will be completed by another provider, this initial triage assessment does not replace that evaluation, and the importance of remaining in the ED until their evaluation is complete.   Note: Portions of this report may have been transcribed using voice recognition software. Every effort was made to ensure accuracy; however, inadvertent computerized transcription errors may still be present.    Deliah Boston, PA-C 02/11/22 1521

## 2022-04-01 ENCOUNTER — Encounter (HOSPITAL_COMMUNITY): Payer: Self-pay

## 2022-04-01 ENCOUNTER — Emergency Department (HOSPITAL_COMMUNITY)
Admission: EM | Admit: 2022-04-01 | Discharge: 2022-04-02 | Disposition: A | Discharge status: Home / Self Care | Payer: No Typology Code available for payment source | Attending: Emergency Medicine | Admitting: Emergency Medicine

## 2022-04-01 ENCOUNTER — Emergency Department (HOSPITAL_COMMUNITY): Payer: No Typology Code available for payment source

## 2022-04-01 DIAGNOSIS — Z7982 Long term (current) use of aspirin: Secondary | ICD-10-CM | POA: Insufficient documentation

## 2022-04-01 DIAGNOSIS — F444 Conversion disorder with motor symptom or deficit: Secondary | ICD-10-CM | POA: Diagnosis not present

## 2022-04-01 DIAGNOSIS — R519 Headache, unspecified: Secondary | ICD-10-CM | POA: Diagnosis not present

## 2022-04-01 DIAGNOSIS — R251 Tremor, unspecified: Secondary | ICD-10-CM | POA: Insufficient documentation

## 2022-04-01 DIAGNOSIS — W19XXXA Unspecified fall, initial encounter: Secondary | ICD-10-CM | POA: Insufficient documentation

## 2022-04-01 DIAGNOSIS — H538 Other visual disturbances: Secondary | ICD-10-CM | POA: Insufficient documentation

## 2022-04-01 DIAGNOSIS — F449 Dissociative and conversion disorder, unspecified: Secondary | ICD-10-CM

## 2022-04-01 DIAGNOSIS — Y92009 Unspecified place in unspecified non-institutional (private) residence as the place of occurrence of the external cause: Secondary | ICD-10-CM | POA: Insufficient documentation

## 2022-04-01 HISTORY — DX: Malignant neoplasm of uterus, part unspecified: C55

## 2022-04-01 LAB — RAPID URINE DRUG SCREEN, HOSP PERFORMED
Amphetamines: NOT DETECTED
Barbiturates: NOT DETECTED
Benzodiazepines: NOT DETECTED
Cocaine: NOT DETECTED
Opiates: NOT DETECTED
Tetrahydrocannabinol: POSITIVE — AB

## 2022-04-01 LAB — CBC WITH DIFFERENTIAL/PLATELET
Abs Immature Granulocytes: 0.02 10*3/uL (ref 0.00–0.07)
Basophils Absolute: 0.1 10*3/uL (ref 0.0–0.1)
Basophils Relative: 1 %
Eosinophils Absolute: 0.1 10*3/uL (ref 0.0–0.5)
Eosinophils Relative: 1 %
HCT: 39.4 % (ref 36.0–46.0)
Hemoglobin: 12.4 g/dL (ref 12.0–15.0)
Immature Granulocytes: 0 %
Lymphocytes Relative: 56 %
Lymphs Abs: 2.6 10*3/uL (ref 0.7–4.0)
MCH: 26.8 pg (ref 26.0–34.0)
MCHC: 31.5 g/dL (ref 30.0–36.0)
MCV: 85.3 fL (ref 80.0–100.0)
Monocytes Absolute: 0.5 10*3/uL (ref 0.1–1.0)
Monocytes Relative: 11 %
Neutro Abs: 1.5 10*3/uL — ABNORMAL LOW (ref 1.7–7.7)
Neutrophils Relative %: 31 %
Platelets: 250 10*3/uL (ref 150–400)
RBC: 4.62 MIL/uL (ref 3.87–5.11)
RDW: 15.5 % (ref 11.5–15.5)
WBC: 4.7 10*3/uL (ref 4.0–10.5)
nRBC: 0 % (ref 0.0–0.2)

## 2022-04-01 LAB — COMPREHENSIVE METABOLIC PANEL
ALT: 36 U/L (ref 0–44)
AST: 55 U/L — ABNORMAL HIGH (ref 15–41)
Albumin: 4.5 g/dL (ref 3.5–5.0)
Alkaline Phosphatase: 45 U/L (ref 38–126)
Anion gap: 6 (ref 5–15)
BUN: 12 mg/dL (ref 6–20)
CO2: 27 mmol/L (ref 22–32)
Calcium: 9.6 mg/dL (ref 8.9–10.3)
Chloride: 105 mmol/L (ref 98–111)
Creatinine, Ser: 0.97 mg/dL (ref 0.44–1.00)
GFR, Estimated: >60 mL/min (ref >60)
Glucose, Bld: 92 mg/dL (ref 70–99)
Potassium: 3.8 mmol/L (ref 3.5–5.1)
Sodium: 138 mmol/L (ref 135–145)
Total Bilirubin: 0.5 mg/dL (ref 0.3–1.2)
Total Protein: 7.5 g/dL (ref 6.5–8.1)

## 2022-04-01 LAB — URINALYSIS, ROUTINE W REFLEX MICROSCOPIC
Bilirubin Urine: NEGATIVE
Glucose, UA: NEGATIVE mg/dL
Hgb urine dipstick: NEGATIVE
Ketones, ur: NEGATIVE mg/dL
Leukocytes,Ua: NEGATIVE
Nitrite: NEGATIVE
Protein, ur: NEGATIVE mg/dL
Specific Gravity, Urine: 1.018 (ref 1.005–1.030)
pH: 7 (ref 5.0–8.0)

## 2022-04-01 LAB — SALICYLATE LEVEL: Salicylate Lvl: <7 mg/dL — ABNORMAL LOW (ref 7.0–30.0)

## 2022-04-01 LAB — ETHANOL: Alcohol, Ethyl (B): <10 mg/dL (ref <10)

## 2022-04-01 NOTE — ED Triage Notes (Signed)
Pt reports that she was at work today and noticed new uncontrollable tremors to head/neck (looks similar to pt shaking her head no). Pt reports that she had a fall, denies head injury/LOC. Endorses slight dizziness when standing.  ?

## 2022-04-01 NOTE — ED Provider Triage Note (Signed)
Emergency Medicine Provider Triage Evaluation Note ? ?Diamond Mccoy , a 41 y.o. female  was evaluated in triage.  Pt complains of tremors onset 1 pm.  Patient notes that she felt tremors to her bilateral upper and lower extremities today which caused her to fall due to feeling unstable.  Patient has associated vision changes and headache.  She was seen at the [place] today and told to come to the emergency department.  Has not tried medication for her symptoms.  Denies hitting her head, LOC, chest pain, shortness of breath. ? ? ? ?Review of Systems  ?Positive: As per HPI above ?Negative:  ? ?Physical Exam  ?BP (!) 154/94 (BP Location: Right Arm)   Pulse 73   Temp 98.6 ?F (37 ?C) (Oral)   Resp 16   Ht '5\' 3"'$  (1.6 m)   Wt 90.7 kg   LMP 06/11/2015 (Exact Date)   SpO2 100%   BMI 35.43 kg/m?  ?Gen:   Awake, no distress   ?Resp:  Normal effort  ?MSK:   Moves extremities without difficulty  ?Other:  No focal neurological deficits.  Negative pronator drift.  Cranial nerves intact.  Patient with tremor to head (shaking no). ? ?Medical Decision Making  ?Medically screening exam initiated at 5:36 PM.  Appropriate orders placed.  Diamond Mccoy was informed that the remainder of the evaluation will be completed by another provider, this initial triage assessment does not replace that evaluation, and the importance of remaining in the ED until their evaluation is complete. ? ?  ?Diamond Mccoy A, PA-C ?04/01/22 1756 ? ?

## 2022-04-02 DIAGNOSIS — F444 Conversion disorder with motor symptom or deficit: Secondary | ICD-10-CM

## 2022-04-02 MED ORDER — PROCHLORPERAZINE EDISYLATE 10 MG/2ML IJ SOLN
10.0000 mg | Freq: Once | INTRAMUSCULAR | Status: AC
  Administered 2022-04-02: 10 mg via INTRAVENOUS
  Filled 2022-04-02: qty 2

## 2022-04-02 MED ORDER — KETOROLAC TROMETHAMINE 30 MG/ML IJ SOLN
15.0000 mg | Freq: Once | INTRAMUSCULAR | Status: AC
  Administered 2022-04-02: 15 mg via INTRAVENOUS
  Filled 2022-04-02: qty 1

## 2022-04-02 MED ORDER — LORAZEPAM 1 MG PO TABS: 1.0000 mg | ORAL_TABLET | Freq: Three times a day (TID) | ORAL | 0 refills | Status: AC | PRN

## 2022-04-02 MED ORDER — LACTATED RINGERS IV BOLUS
1000.0000 mL | Freq: Once | INTRAVENOUS | Status: AC
  Administered 2022-04-02: 1000 mL via INTRAVENOUS

## 2022-04-02 MED ORDER — DEXAMETHASONE SODIUM PHOSPHATE 10 MG/ML IJ SOLN
10.0000 mg | Freq: Once | INTRAMUSCULAR | Status: AC
Start: 2022-04-02 — End: 2022-04-02
  Administered 2022-04-02: 10 mg via INTRAVENOUS
  Filled 2022-04-02: qty 1

## 2022-04-02 MED ORDER — DIPHENHYDRAMINE HCL 50 MG/ML IJ SOLN
25.0000 mg | Freq: Once | INTRAMUSCULAR | Status: AC
  Administered 2022-04-02: 25 mg via INTRAVENOUS
  Filled 2022-04-02: qty 1

## 2022-04-02 MED ORDER — DIAZEPAM 5 MG/ML IJ SOLN
2.5000 mg | Freq: Once | INTRAMUSCULAR | Status: AC
  Administered 2022-04-02: 2.5 mg via INTRAVENOUS
  Filled 2022-04-02: qty 2

## 2022-04-02 NOTE — Consult Note (Signed)
Neurology Consultation ? ?Reason for Consult: new onset head tremor, headache ?Referring Physician:  Dr. Dayna Barker ? ?CC: new onset head tremor, headache ? ?History is obtained from: Patient, family member at bedside, chart ? ?HPI: Diamond Mccoy is a 41 y.o. female veteran of the [place], PMH of PTSD anxiety and depression with care established at the [employer] couple of antidepressants at home which she takes mostly as needed, presenting to the emergency room for evaluation of a myriad of neurological symptoms from this afternoon. ?She is a Pharmacist, hospital at [employer], was in the restroom and was sudden onset of inability to move and fell on the ground and could not move for a while but then was able to move and get back to her office with tremulousness of her limbs.  This was followed by a feeling of head rush and headache which has persisted that she describes a throbbing headache that has been persistent since yesterday afternoon.  This was followed by her head starting to tremble, which according to the patient and family has been persistent. ?No prior such episodes.  No history of strokes in the past.  No history of head injury or head trauma. ? ?Does not report any unusual stressors but does reveal that the job is stressful and she is looking forward to a vacation. ? ?No SI/HI ? ? ?LKW: Sometime in the afternoon of 04/01/2022 ?tpa given?: no, examination not consistent with stroke ?Premorbid modified Rankin scale (mRS): 0 ? ?ROS: Full ROS was performed and is negative except as noted in the HPI.  ? ?Past Medical History:  ?Diagnosis Date  ? Achilles tendon rupture 05/28/2015  ? right  ? Dental crowns present   ? Uterine cancer ([place])   ? ?No family history on file. ? ? ?Social History:  ? reports that she has never smoked. She has never used smokeless tobacco. She reports current alcohol use. She reports that she does not use drugs. ? ?Medications ? ?Current Facility-Administered Medications:  ?   dexamethasone (DECADRON) injection 10 mg, 10 mg, Intravenous, Once, Mesner, Corene Cornea, MD ?  diazepam (VALIUM) injection 2.5 mg, 2.5 mg, Intravenous, Once, Mesner, Corene Cornea, MD ?  diphenhydrAMINE (BENADRYL) injection 25 mg, 25 mg, Intravenous, Once, Mesner, Corene Cornea, MD ?  ketorolac (TORADOL) 30 MG/ML injection 15 mg, 15 mg, Intravenous, Once, Mesner, Corene Cornea, MD ?  lactated ringers bolus 1,000 mL, 1,000 mL, Intravenous, Once, Mesner, Corene Cornea, MD ?  prochlorperazine (COMPAZINE) injection 10 mg, 10 mg, Intravenous, Once, Mesner, Corene Cornea, MD ? ?Current Outpatient Medications:  ?  aspirin EC 325 MG tablet, Take 1 tablet (325 mg total) by mouth daily., Disp: 42 tablet, Rfl: 0 ?  Biotin 1000 MCG tablet, Take 1,000 mcg by mouth 3 (three) times daily., Disp: , Rfl:  ?  docusate sodium (COLACE) 100 MG capsule, Take 1 capsule (100 mg total) by mouth 2 (two) times daily. While taking narcotic pain medicine., Disp: 30 capsule, Rfl: 0 ?  oxyCODONE (ROXICODONE) 5 MG immediate release tablet, Take 1-2 tablets (5-10 mg total) by mouth every 4 (four) hours as needed for moderate pain or severe pain., Disp: 30 tablet, Rfl: 0 ?  senna (SENOKOT) 8.6 MG TABS tablet, Take 2 tablets (17.2 mg total) by mouth 2 (two) times daily., Disp: 30 each, Rfl: 0 ? ? ?Exam: ?Current vital signs: ?BP 126/75   Pulse 74   Temp 98.6 ?F (37 ?C) (Oral)   Resp 19   Ht _0  (1.6 m)   Wt  90.7 kg   LMP 06/11/2015 (Exact Date)   SpO2 97%   BMI 35.43 kg/m?  ?Vital signs in last 24 hours: ?Temp:  [98.6 ?F (37 ?C)] 98.6 ?F (37 ?C) (04/06 1717) ?Pulse Rate:  [69-80] 74 (04/07 0330) ?Resp:  [14-19] 19 (04/07 0330) ?BP: (126-154)/(74-104) 126/75 (04/07 0330) ?SpO2:  [97 %-100 %] 97 % (04/07 0330) ?Weight:  [90.7 kg] 90.7 kg (04/06 1727) ?GENERAL: Awake, alert in NAD ?HEENT: - Normocephalic and atraumatic, dry mm, no LN++, no Thyromegally ?LUNGS - Clear to auscultation bilaterally with no wheezes ?CV - S1S2 RRR, no m/r/g, equal pulses bilaterally. ?ABDOMEN - Soft, nontender,  nondistended with normoactive BS ?Ext: warm, well perfused, intact peripheral pulses, no edema ? ?NEURO:  ?Mental Status: AA&Ox3  ?Language: speech is clear.  Naming, repetition, fluency, and comprehension intact. ?Cranial Nerves: PERRLmm/brisk. EOMI, visual fields full, no facial asymmetry, facial sensation intact, hearing intact, tongue/uvula/soft palate midline, normal sternocleidomastoid and trapezius muscle strength. No evidence of tongue atrophy or fibrillations ?Motor: 5/5 in all fours without drift ?Tone: is normal and bulk is normal ?Sensation- Intact to light touch bilaterally ?Coordination: FTN intact bilaterally, no ataxia in BLE.  There is a noticeable no-no head tremor which is easily distractible while performing tasks such as finger tapping's or finger-to-nose testing. ?Gait- deferred ?NIHSS--0 ? ? ?Labs ?I have reviewed labs in epic and the results pertinent to this consultation are: ? ? ?CBC ?   ?Component Value Date/Time  ? WBC 4.7 04/01/2022 1808  ? RBC 4.62 04/01/2022 1808  ? HGB 12.4 04/01/2022 1808  ? HCT 39.4 04/01/2022 1808  ? PLT 250 04/01/2022 1808  ? MCV 85.3 04/01/2022 1808  ? MCH 26.8 04/01/2022 1808  ? MCHC 31.5 04/01/2022 1808  ? RDW 15.5 04/01/2022 1808  ? LYMPHSABS 2.6 04/01/2022 1808  ? MONOABS 0.5 04/01/2022 1808  ? EOSABS 0.1 04/01/2022 1808  ? BASOSABS 0.1 04/01/2022 1808  ? ? ?CMP  ?   ?Component Value Date/Time  ? NA 138 04/01/2022 1808  ? K 3.8 04/01/2022 1808  ? CL 105 04/01/2022 1808  ? CO2 27 04/01/2022 1808  ? GLUCOSE 92 04/01/2022 1808  ? GLUCOSE 96 01/03/2007 1115  ? BUN 12 04/01/2022 1808  ? CREATININE 0.97 04/01/2022 1808  ? CALCIUM 9.6 04/01/2022 1808  ? PROT 7.5 04/01/2022 1808  ? ALBUMIN 4.5 04/01/2022 1808  ? AST 55 (H) 04/01/2022 1808  ? ALT 36 04/01/2022 1808  ? ALKPHOS 45 04/01/2022 1808  ? BILITOT 0.5 04/01/2022 1808  ? GFRNONAA >60 04/01/2022 1808  ? ? ?Lipid Panel  ?   ?Component Value Date/Time  ? CHOL 164 01/03/2007 1115  ? TRIG 36 01/03/2007 1115  ? HDL  59.9 01/03/2007 1115  ? CHOLHDL 2.7 CALC 01/03/2007 1115  ? VLDL 7 01/03/2007 1115  ? [place] 97 01/03/2007 1115  ? ? ? ?Imaging ?I have reviewed the images obtained: ? ?CT-head-no acute changes ? ?Assessment:  ?41 year old veteran of the [place] [employer], history of PTSD anxiety depression, presenting to the emergency room for evaluation of myriad of neurological symptoms-most pressing headache with throbbing nature as well as a no-no type head tremor which is easily distractible and does not appear to be neurological in nature. 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 ?That said, the new headache could represent a new migraine and I would recommend treating her with a migraine cocktail. ? ?Recommendations: ?Migraine cocktail per  ER ?Outpatient follow-up with neurology for headache and if the tremor persists. ?Ativan as needed as an outpatient for the next few days ?Continue antidepressants per psychiatry. ?Plan discussed with Dr. Dayna Barker as well as the patient. ? ?-- ?Amie Portland, MD ?Neurologist ?[facility] ?Pager: [phone] ? ?

## 2022-04-02 NOTE — ED Provider Notes (Signed)
?[facility] ?Provider Note ? ? ?CSN: [id number] ?Arrival date & time: 04/01/22  1713 ? ?  ? ?History ? ?Chief Complaint  ?Patient presents with  ? Tremors  ? Fall  ? ? ?Diamond Mccoy is a 41 y.o. female. ? ?41 yo F here with abnormal movements.  Patient states that she was in her normal state of health and she went to go to a door to unlock it when all of a sudden she had tremors in both of her hands.  She states that she fell to the floor because of this.  Did not hurt her self in the fall.  States that it continued.  She tried to yell for help but could not say any words.  She states that this went on for a couple minutes until she started to feel better and she got up and walked back to her desk still with a mild tremor.  She developed a headache and vision changes during this that lasted a couple minutes that also self resolved.  Apparently was get back to the desk she was very sweaty and a coworker asked her to what happened and she could not explain to her.  She subsequently has developed a abnormal movement of her head where she moves it left and right like she shaking her head no.  No other neurologic changes.  She states that she is able to ambulate without difficulty at this time.  She states that everything else feels normal except for when she stands up she will feel a rush and a frontal headache.  Denies any significant stress.  Eating and drinking okay recently.  No recent trauma.  No recent medication changes.  Denies alcohol drugs or tobacco. ? ? ?Fall ? ? ?  ? ?Home Medications ?Prior to Admission medications   ?Medication Sig Start Date End Date Taking? Authorizing Provider  ?aspirin EC 325 MG tablet Take 1 tablet (325 mg total) by mouth daily. 06/19/15   Corky Sing, PA-C  ?Biotin 1000 MCG tablet Take 1,000 mcg by mouth 3 (three) times daily.    [provider]  ?docusate sodium (COLACE) 100 MG capsule Take 1 capsule (100 mg total) by mouth 2  (two) times daily. While taking narcotic pain medicine. 06/19/15   Corky Sing, PA-C  ?oxyCODONE (ROXICODONE) 5 MG immediate release tablet Take 1-2 tablets (5-10 mg total) by mouth every 4 (four) hours as needed for moderate pain or severe pain. 06/19/15   Corky Sing, PA-C  ?senna (SENOKOT) 8.6 MG TABS tablet Take 2 tablets (17.2 mg total) by mouth 2 (two) times daily. 06/19/15   Corky Sing, PA-C  ?   ? ?Allergies    ?Shrimp (diagnostic)   ? ?Review of Systems   ?Review of Systems ? ?Physical Exam ?Updated Vital Signs ?BP (!) 147/74   Pulse 80   Temp 98.6 ?F (37 ?C) (Oral)   Resp 19   Ht '5\' 3"'$  (1.6 m)   Wt 90.7 kg   LMP 06/11/2015 (Exact Date)   SpO2 99%   BMI 35.43 kg/m?  ?Physical Exam ?Vitals and nursing note reviewed.  ?Constitutional:   ?   Appearance: She is well-developed.  ?HENT:  ?   Head: Normocephalic and atraumatic.  ?   Mouth/Throat:  ?   Mouth: Mucous membranes are moist.  ?   Pharynx: Oropharynx is clear.  ?Eyes:  ?   Pupils: Pupils are equal, round, and reactive to  light.  ?Cardiovascular:  ?   Rate and Rhythm: Normal rate and regular rhythm.  ?Pulmonary:  ?   Effort: No respiratory distress.  ?   Breath sounds: No stridor.  ?Abdominal:  ?   General: Abdomen is flat. There is no distension.  ?Musculoskeletal:  ?   Cervical back: Normal range of motion.  ?Neurological:  ?   Mental Status: She is alert and oriented to person, place, and time.  ?   Cranial Nerves: No cranial nerve deficit.  ?   Sensory: No sensory deficit.  ?   Motor: No weakness.  ?   Coordination: Coordination normal.  ?   Gait: Gait normal.  ?   Comments: Head is moving in a shaking manner. It varies in amplitude and frequency. It seems to totally extinguish at times as well.  ?Able to dorsiflex and plantarflex normally. ?Able to have symmetric grip strength and bicep/tricep strength ?Intact sensation to light touch in feet and hands. ?Symmetric facial movements. ?No dysarthria.   ? ? ?ED Results /  Procedures / Treatments   ?Labs ?(all labs ordered are listed, but only abnormal results are displayed) ?Labs Reviewed  ?RAPID URINE DRUG SCREEN, HOSP PERFORMED - Abnormal; Notable for the following components:  ?    Result Value  ? Tetrahydrocannabinol POSITIVE (*)   ? All other components within normal limits  ?COMPREHENSIVE METABOLIC PANEL - Abnormal; Notable for the following components:  ? AST 55 (*)   ? All other components within normal limits  ?SALICYLATE LEVEL - Abnormal; Notable for the following components:  ? Salicylate Lvl <8.2 (*)   ? All other components within normal limits  ?CBC WITH DIFFERENTIAL/PLATELET - Abnormal; Notable for the following components:  ? Neutro Abs 1.5 (*)   ? All other components within normal limits  ?URINALYSIS, ROUTINE W REFLEX MICROSCOPIC  ?ETHANOL  ? ? ?EKG ?EKG Interpretation ? ?Date/Time:  Thursday April 01 2022 17:18:50 EDT ?Ventricular Rate:  77 ?PR Interval:  168 ?QRS Duration: 80 ?QT Interval:  400 ?QTC Calculation: 452 ?R Axis:   79 ?Text Interpretation: Normal sinus rhythm Normal ECG When compared with ECG of 11-Feb-2022 20:53, PREVIOUS ECG IS PRESENT Confirmed by Merrily Pew [phone]) on 04/02/2022 2:54:01 AM ? ?Radiology ?CT Head Wo Contrast ? ?Result Date: 04/01/2022 ?CLINICAL DATA:  Tremors, headache and fall. EXAM: CT HEAD WITHOUT CONTRAST TECHNIQUE: Contiguous axial images were obtained from the base of the skull through the vertex without intravenous contrast. RADIATION DOSE REDUCTION: This exam was performed according to the departmental dose-optimization program which includes automated exposure control, adjustment of the mA and/or kV according to patient size and/or use of iterative reconstruction technique. COMPARISON:  None. FINDINGS: Brain: The brain demonstrates no evidence of hemorrhage, infarction, edema, mass effect, extra-axial fluid collection, hydrocephalus or mass lesion. Vascular: No hyperdense vessel or unexpected calcification. Skull: Normal.  Negative for fracture or focal lesion. Sinuses/Orbits: No acute finding. Other: None. IMPRESSION: Normal head CT. Electronically Signed   By: Aletta Edouard M.D.   On: 04/01/2022 19:01   ? ?Procedures ?Procedures  ? ? ?Medications Ordered in ED ?Medications  ?lactated ringers bolus 1,000 mL (has no administration in time range)  ?diazepam (VALIUM) injection 2.5 mg (has no administration in time range)  ? ? ?ED Course/ Medical Decision Making/ A&P ?  ?                        ?Medical Decision Making ?Risk ?Prescription drug management. ? ? ?  Considered multiple possible etiologies for symptoms.  Work-up was already done before she got by here so the get a light that she has a stroke.  Does not seem consistent with multiple sclerosis with so many different areas involved.  Doubt stroke.  No evidence of electrolyte abnormality.  No evidence of cardiac causes.  I wonder if she has a manifestation of stress such as conversion disorder.  I do not see any emergent causes for your symptoms however will engage neurology for further recommendations.  In the meantime we will give some fluids and Valium. ? ?Neuro consulted to ensure not missing an organic neurologic problem. Dr. Rory Percy suggested possibly complex migraine, will add on migraine cocktail. Will seek neuro/psychiatric care at [facility].  ? ? ?Final Clinical Impression(s) / ED Diagnoses ?Final diagnoses:  ?None  ? ? ?Rx / DC Orders ?ED Discharge Orders   ? ? None  ? ?  ? ? ?  ?Merrily Pew, MD ?04/02/22 [phone] ? ?

## 2022-06-19 IMAGING — CT CT HEAD W/O CM
3 series · 15 of 47 positions shown, 18 images · non-contrast
Comparison: None.

CLINICAL DATA: Tremors, headache and fall.



[Series 3: head 5.0 h30s · axial · 0.48mm/px · z∈[-203,-78]mm · 9 of 31 slices shown, 12 images]
[im 3/31  brain]
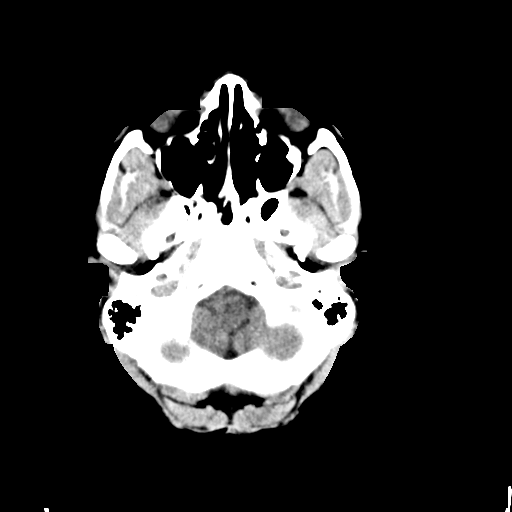
[im 3/31  bone]
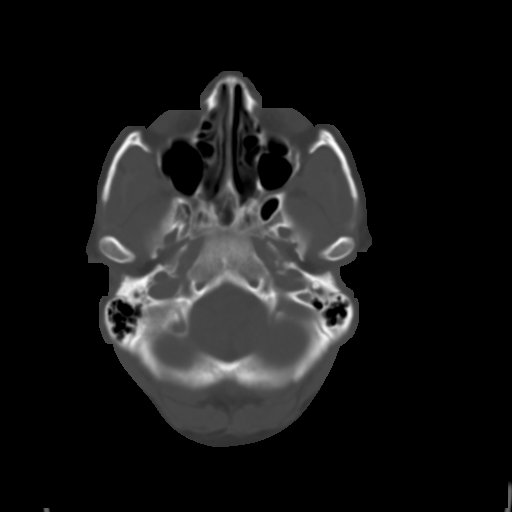
[im 6/31  brain]
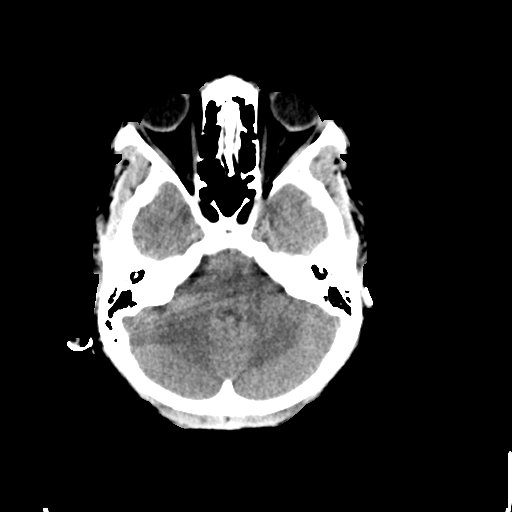
[im 9/31  brain]
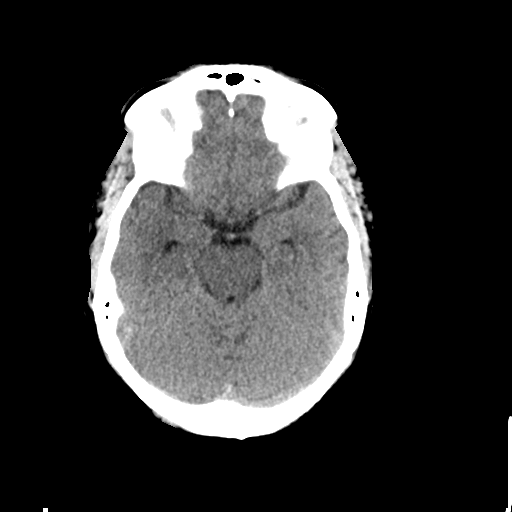
[im 12/31  brain]
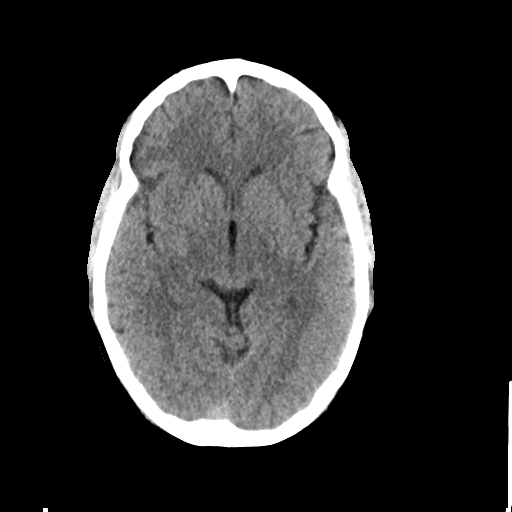
[im 16/31  brain]
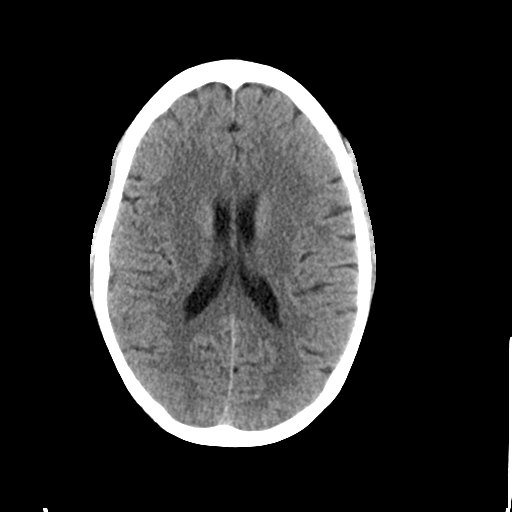
[im 16/31  bone]
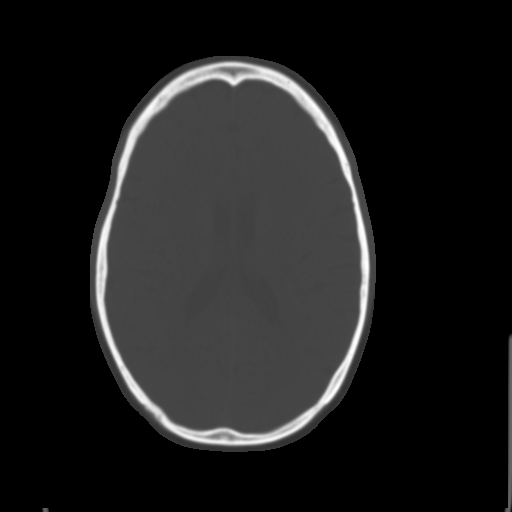
[im 19/31  brain]
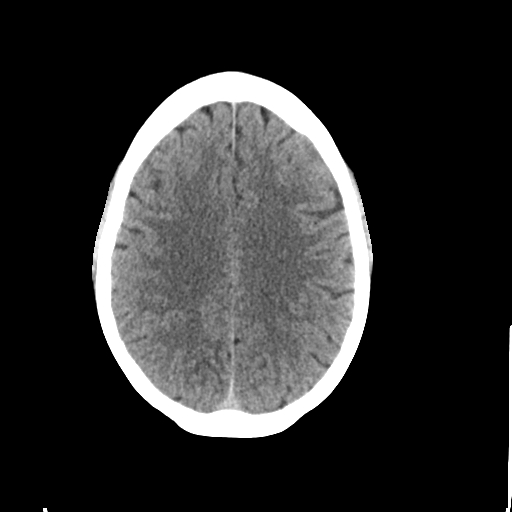
[im 22/31  brain]
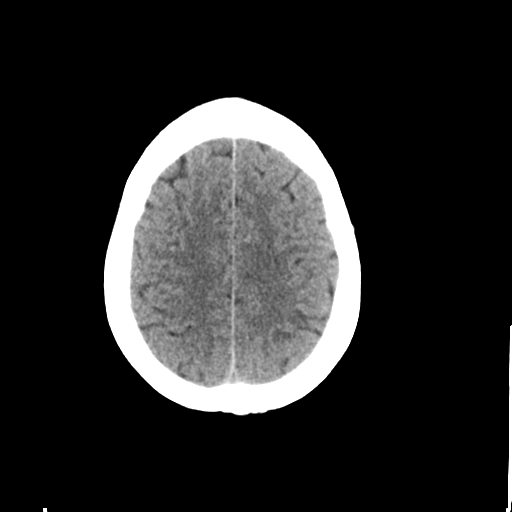
[im 25/31  brain]
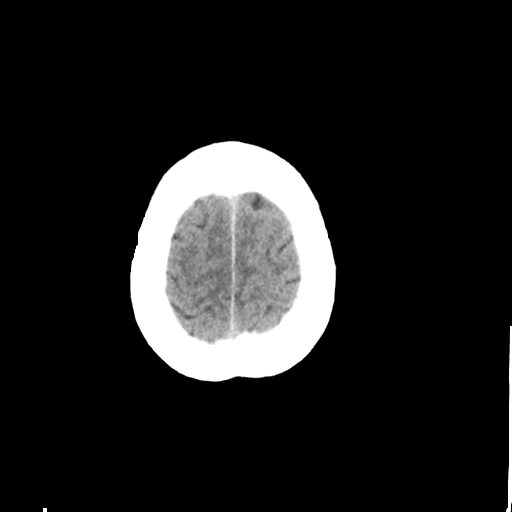
[im 28/31  brain]
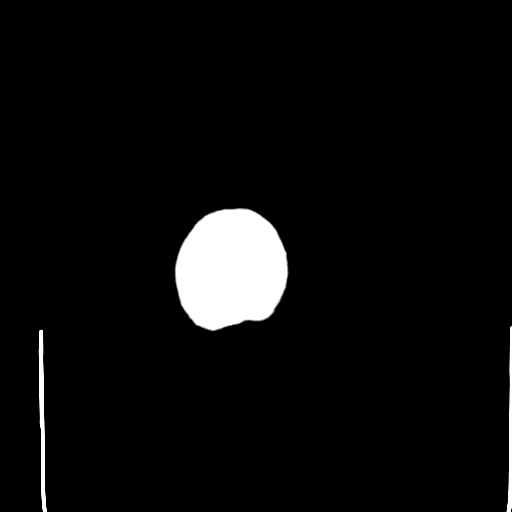
[im 28/31  bone]
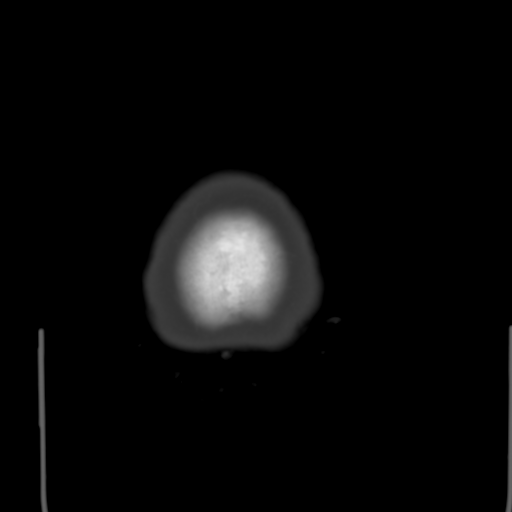

[Series 5: head 3.0 mpr cor · coronal · 0.31mm/px · 3 of 73 slices shown]
[im 25/73  brain]
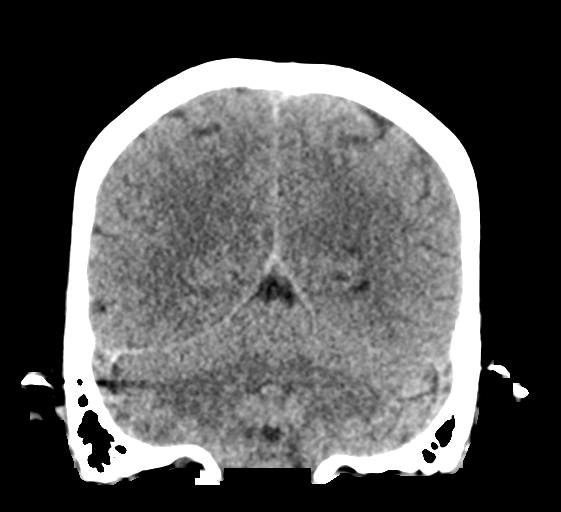
[im 33/73  brain]
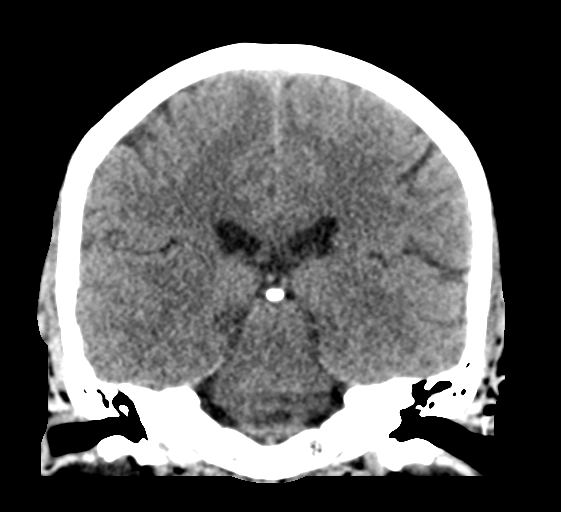
[im 41/73  brain]
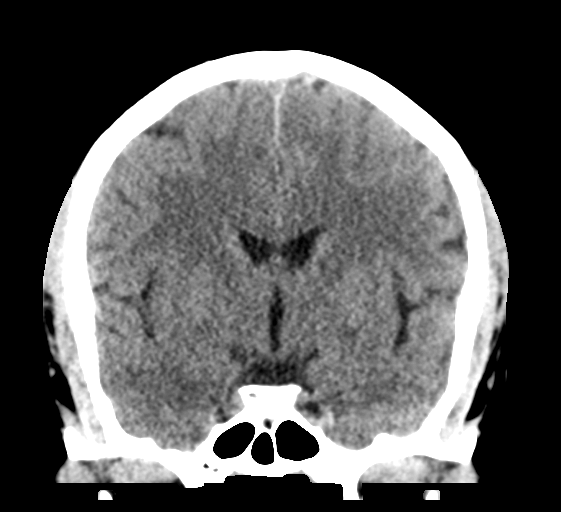

[Series 6: head 3.0 mpr sag · sagittal · 0.30mm/px · 3 of 60 slices shown]
[im 20/60  brain]
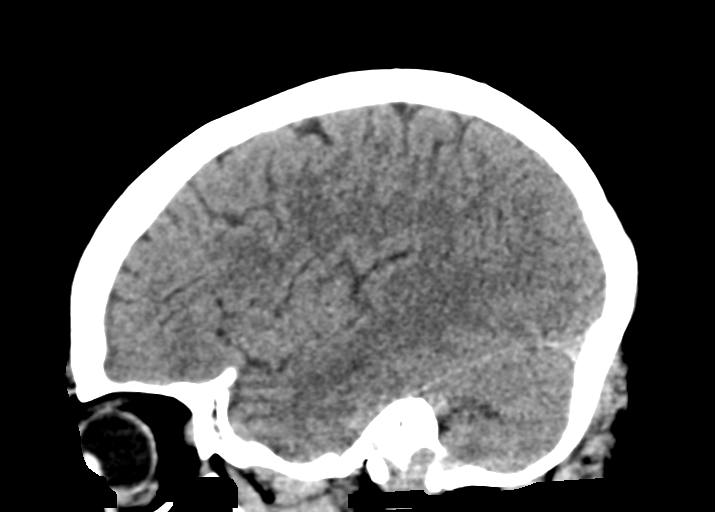
[im 30/60  brain]
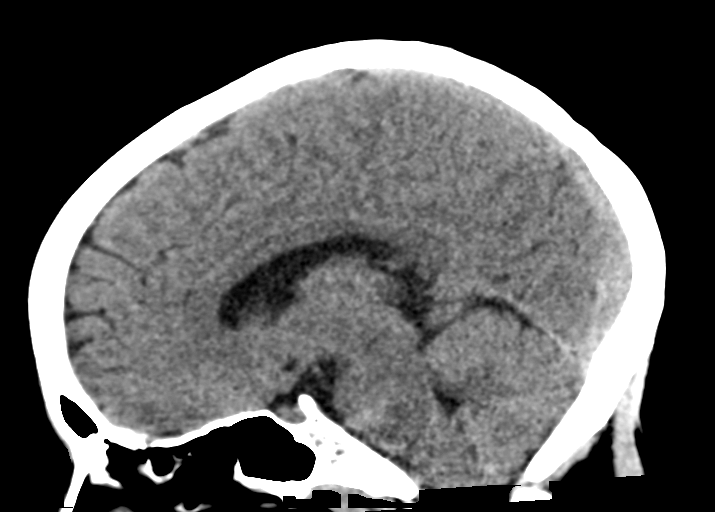
[im 40/60  brain]
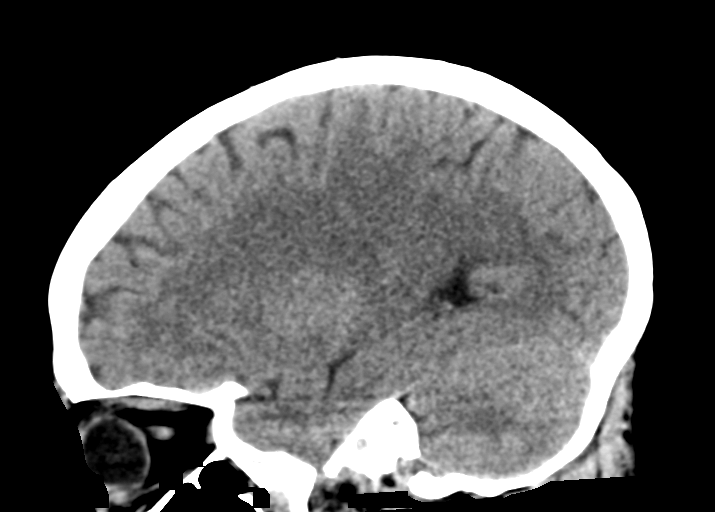

[15 of 47 positions shown; findings below may reference images not displayed]

FINDINGS: Brain: The brain demonstrates no evidence of hemorrhage, infarction,
edema, mass effect, extra-axial fluid collection, hydrocephalus or
mass lesion.

Vascular: No hyperdense vessel or unexpected calcification.

Skull: Normal. Negative for fracture or focal lesion.

Sinuses/Orbits: No acute finding.

Other: None.
IMPRESSION: Normal head CT.

## 2022-09-15 ENCOUNTER — Telehealth: Payer: Self-pay | Admitting: Genetic Counselor

## 2022-09-15 NOTE — Telephone Encounter (Signed)
Scheduled appt per 9/19 referral. Pt is aware of appt date and time. Pt is aware to arrive 15 mins prior to appt time and to bring and updated insurance card. Pt is aware of appt location.   

## 2022-11-05 ENCOUNTER — Other Ambulatory Visit: Payer: Self-pay

## 2022-11-05 ENCOUNTER — Encounter (HOSPITAL_BASED_OUTPATIENT_CLINIC_OR_DEPARTMENT_OTHER): Payer: Self-pay | Admitting: Emergency Medicine

## 2022-11-05 ENCOUNTER — Emergency Department (HOSPITAL_BASED_OUTPATIENT_CLINIC_OR_DEPARTMENT_OTHER)
Admission: EM | Admit: 2022-11-05 | Discharge: 2022-11-05 | Disposition: A | Discharge status: Home / Self Care | Payer: BC Managed Care – PPO | Attending: Emergency Medicine | Admitting: Emergency Medicine

## 2022-11-05 DIAGNOSIS — Z4800 Encounter for change or removal of nonsurgical wound dressing: Secondary | ICD-10-CM | POA: Insufficient documentation

## 2022-11-05 DIAGNOSIS — Z4802 Encounter for removal of sutures: Secondary | ICD-10-CM | POA: Diagnosis not present

## 2022-11-05 DIAGNOSIS — Z5189 Encounter for other specified aftercare: Secondary | ICD-10-CM

## 2022-11-05 NOTE — ED Triage Notes (Signed)
Request staples removed from head wound. Place 10/25/22 2 staples  placed in hairline approximated.

## 2022-11-05 NOTE — ED Provider Notes (Signed)
[facility] Provider Note   CSN: [id number] Arrival date & time: 11/05/22  9675     History  Chief Complaint  Patient presents with   Suture / Staple Removal    Diamond Mccoy is a 41 y.o. female.  2 staples in right scalp from 10/30. No drainage. No pain. No fevers. No other issues.    Suture / Staple Removal       Home Medications Prior to Admission medications   Medication Sig Start Date End Date Taking? Authorizing Provider  aspirin EC 325 MG tablet Take 1 tablet (325 mg total) by mouth daily. 06/19/15   Corky Sing, PA-C  Biotin 1000 MCG tablet Take 1,000 mcg by mouth 3 (three) times daily.    [provider]  docusate sodium (COLACE) 100 MG capsule Take 1 capsule (100 mg total) by mouth 2 (two) times daily. While taking narcotic pain medicine. 06/19/15   Corky Sing, PA-C  LORazepam (ATIVAN) 1 MG tablet Take 1 tablet (1 mg total) by mouth 3 (three) times daily as needed for anxiety. 04/02/22   Hilde Churchman, Corene Cornea, MD  oxyCODONE (ROXICODONE) 5 MG immediate release tablet Take 1-2 tablets (5-10 mg total) by mouth every 4 (four) hours as needed for moderate pain or severe pain. 06/19/15   Corky Sing, PA-C  senna (SENOKOT) 8.6 MG TABS tablet Take 2 tablets (17.2 mg total) by mouth 2 (two) times daily. 06/19/15   Corky Sing, PA-C      Allergies    Shrimp (diagnostic)    Review of Systems   Review of Systems  Physical Exam Updated Vital Signs BP (!) 140/85 (BP Location: Right Arm)   Pulse 88   Temp 98.5 F (36.9 C) (Oral)   Resp 16   LMP 06/11/2015 (Exact Date)   SpO2 99%  Physical Exam Vitals and nursing note reviewed.  Constitutional:      Appearance: She is well-developed.  HENT:     Head: Normocephalic and atraumatic.  Eyes:     Pupils: Pupils are equal, round, and reactive to light.  Cardiovascular:     Rate and Rhythm: Normal rate and regular rhythm.  Pulmonary:     Effort: No respiratory  distress.     Breath sounds: No stridor.  Abdominal:     General: There is no distension.  Musculoskeletal:     Cervical back: Normal range of motion.  Skin:    General: Skin is warm and dry.     Comments: 2 small staples to right frontal scalp area. No erythema, purulence or significant tenderness around the wound.   Neurological:     Mental Status: She is alert.     ED Results / Procedures / Treatments   Labs (all labs ordered are listed, but only abnormal results are displayed) Labs Reviewed - No data to display  EKG None  Radiology No results found.  Procedures .Suture Removal  Date/Time: 11/05/2022 2:44 AM  Performed by: Merrily Pew, MD Authorized by: Merrily Pew, MD   Consent:    Consent obtained:  Verbal   Consent given by:  Patient   Risks, benefits, and alternatives were discussed: yes     Risks discussed:  Bleeding, pain and wound separation   Alternatives discussed:  No treatment Universal protocol:    Procedure explained and questions answered to patient or proxy's satisfaction: yes     Relevant documents present and verified: yes     Patient identity confirmed:  Verbally  with patient Location:    Location:  [place] location:  Scalp Procedure details:    Wound appearance:  No signs of infection   Number of staples removed:  2 Post-procedure details:    Post-removal:  No dressing applied   Procedure completion:  Tolerated     Medications Ordered in ED Medications - No data to display  ED Course/ Medical Decision Making/ A&P                           Medical Decision Making  Wound looks good 2 staples removed. Discussed awaiting until wound healed before scrubbign hair but that she is ok to go ahead and wash her hair non-vigorously.   Final Clinical Impression(s) / ED Diagnoses Final diagnoses:  Encounter for staple removal  Visit for wound check    Rx / DC Orders ED Discharge Orders     None         Jeremiah Tarpley,  Corene Cornea, MD 11/05/22 0246

## 2022-11-05 NOTE — ED Notes (Signed)
Reviewed AVS/discharge instruction with patient. Time allotted for and all questions answered. Patient is agreeable for d/c and escorted to ed exit by staff.  

## 2022-11-07 ENCOUNTER — Other Ambulatory Visit: Payer: Self-pay

## 2022-11-07 ENCOUNTER — Encounter (HOSPITAL_BASED_OUTPATIENT_CLINIC_OR_DEPARTMENT_OTHER): Payer: Self-pay

## 2022-11-07 ENCOUNTER — Emergency Department (HOSPITAL_BASED_OUTPATIENT_CLINIC_OR_DEPARTMENT_OTHER)
Admission: EM | Admit: 2022-11-07 | Discharge: 2022-11-07 | Disposition: A | Discharge status: Home / Self Care | Payer: No Typology Code available for payment source | Attending: Emergency Medicine | Admitting: Emergency Medicine

## 2022-11-07 ENCOUNTER — Emergency Department (HOSPITAL_BASED_OUTPATIENT_CLINIC_OR_DEPARTMENT_OTHER): Payer: No Typology Code available for payment source | Admitting: Radiology

## 2022-11-07 DIAGNOSIS — R42 Dizziness and giddiness: Secondary | ICD-10-CM | POA: Insufficient documentation

## 2022-11-07 DIAGNOSIS — R0789 Other chest pain: Secondary | ICD-10-CM | POA: Insufficient documentation

## 2022-11-07 DIAGNOSIS — R0602 Shortness of breath: Secondary | ICD-10-CM | POA: Diagnosis not present

## 2022-11-07 DIAGNOSIS — R079 Chest pain, unspecified: Secondary | ICD-10-CM

## 2022-11-07 DIAGNOSIS — R55 Syncope and collapse: Secondary | ICD-10-CM | POA: Insufficient documentation

## 2022-11-07 DIAGNOSIS — Z7982 Long term (current) use of aspirin: Secondary | ICD-10-CM | POA: Diagnosis not present

## 2022-11-07 LAB — CBC
HCT: 41.2 % (ref 36.0–46.0)
Hemoglobin: 13.2 g/dL (ref 12.0–15.0)
MCH: 26.7 pg (ref 26.0–34.0)
MCHC: 32 g/dL (ref 30.0–36.0)
MCV: 83.2 fL (ref 80.0–100.0)
Platelets: 302 10*3/uL (ref 150–400)
RBC: 4.95 MIL/uL (ref 3.87–5.11)
RDW: 15.1 % (ref 11.5–15.5)
WBC: 4.3 10*3/uL (ref 4.0–10.5)
nRBC: 0 % (ref 0.0–0.2)

## 2022-11-07 LAB — BASIC METABOLIC PANEL
Anion gap: 12 (ref 5–15)
BUN: 13 mg/dL (ref 6–20)
CO2: 21 mmol/L — ABNORMAL LOW (ref 22–32)
Calcium: 9.8 mg/dL (ref 8.9–10.3)
Chloride: 103 mmol/L (ref 98–111)
Creatinine, Ser: 0.72 mg/dL (ref 0.44–1.00)
GFR, Estimated: >60 mL/min (ref >60)
Glucose, Bld: 114 mg/dL — ABNORMAL HIGH (ref 70–99)
Potassium: 3.9 mmol/L (ref 3.5–5.1)
Sodium: 136 mmol/L (ref 135–145)

## 2022-11-07 LAB — TROPONIN I (HIGH SENSITIVITY)
Troponin I (High Sensitivity): 4 ng/L (ref <18)
Troponin I (High Sensitivity): 3 ng/L (ref <18)

## 2022-11-07 MED ORDER — LACTATED RINGERS IV BOLUS
1000.0000 mL | Freq: Once | INTRAVENOUS | Status: AC
  Administered 2022-11-07: 1000 mL via INTRAVENOUS

## 2022-11-07 NOTE — Discharge Instructions (Signed)
Your work-up today was overall reassuring.  No concerning cause of your passing out episode was identified.  You did mention you have not been hydrating well.  You did receive some fluids in the emergency room.  Continue to drink plenty of fluids at home as well.  I recommend you call your cardiologist to schedule a follow-up appointment.  For any concerning symptoms please return to the emergency room.

## 2022-11-07 NOTE — ED Provider Notes (Signed)
[facility] Provider Note   CSN: [id number] Arrival date & time: 11/07/22  7564     History  Chief Complaint  Patient presents with   Chest Pain   Shortness of Breath   Dizziness    Diamond Mccoy is a 41 y.o. female.   Pt complains of elevated blood pressure, possible syncopal episode that occurred Friday associated with some chest discomfort.  This lasted 15 minutes.  Chest pain has been ongoing for quite some time even prior to this episode and has been intermittent since this episode as well.  Chest pain is not new.  She has had cardiac work-up in the past.  States her systolic was around 332 when this happened.  Reports compliance with amlodipine.  Denies head injury.  Initially reported she had anxiety that was acting up.  However currently during my interview she states that it is improved after she took her medications this morning.  The history is provided by the patient. No language interpreter was used.       Home Medications Prior to Admission medications   Medication Sig Start Date End Date Taking? Authorizing Provider  aspirin EC 325 MG tablet Take 1 tablet (325 mg total) by mouth daily. 06/19/15   Corky Sing, PA-C  Biotin 1000 MCG tablet Take 1,000 mcg by mouth 3 (three) times daily.    [provider]  docusate sodium (COLACE) 100 MG capsule Take 1 capsule (100 mg total) by mouth 2 (two) times daily. While taking narcotic pain medicine. 06/19/15   Corky Sing, PA-C  LORazepam (ATIVAN) 1 MG tablet Take 1 tablet (1 mg total) by mouth 3 (three) times daily as needed for anxiety. 04/02/22   Mesner, Corene Cornea, MD  oxyCODONE (ROXICODONE) 5 MG immediate release tablet Take 1-2 tablets (5-10 mg total) by mouth every 4 (four) hours as needed for moderate pain or severe pain. 06/19/15   Corky Sing, PA-C  senna (SENOKOT) 8.6 MG TABS tablet Take 2 tablets (17.2 mg total) by mouth 2 (two) times daily. 06/19/15   Corky Sing, PA-C      Allergies    Shrimp (diagnostic)    Review of Systems   Review of Systems  Constitutional:  Negative for chills and fever.  Respiratory:  Negative for shortness of breath.   Cardiovascular:  Positive for chest pain. Negative for palpitations and leg swelling.  Gastrointestinal:  Negative for abdominal pain and nausea.  Neurological:  Positive for light-headedness. Negative for syncope.  All other systems reviewed and are negative.   Physical Exam Updated Vital Signs BP 133/79 (BP Location: Right Arm)   Pulse 79   Temp 98.2 F (36.8 C) (Oral)   Resp 18   LMP 06/11/2015 (Exact Date)   SpO2 98%  Physical Exam Vitals and nursing note reviewed.  Constitutional:      General: She is not in acute distress.    Appearance: Normal appearance. She is not ill-appearing.  HENT:     Head: Normocephalic and atraumatic.     Nose: Nose normal.  Eyes:     General: No scleral icterus.    Extraocular Movements: Extraocular movements intact.     Conjunctiva/sclera: Conjunctivae normal.  Cardiovascular:     Rate and Rhythm: Normal rate and regular rhythm.     Pulses: Normal pulses.  Pulmonary:     Effort: Pulmonary effort is normal. No respiratory distress.     Breath sounds: Normal breath sounds. No wheezing or  rales.  Abdominal:     General: There is no distension.     Tenderness: There is no abdominal tenderness.  Musculoskeletal:        General: Normal range of motion.     Cervical back: Normal range of motion.     Right lower leg: No edema.     Left lower leg: No edema.  Skin:    General: Skin is warm and dry.  Neurological:     General: No focal deficit present.     Mental Status: She is alert. Mental status is at baseline.     ED Results / Procedures / Treatments   Labs (all labs ordered are listed, but only abnormal results are displayed) Labs Reviewed  BASIC METABOLIC PANEL - Abnormal; Notable for the following components:      Result Value   CO2 21  (*)    Glucose, Bld 114 (*)    All other components within normal limits  CBC  PREGNANCY, URINE  TROPONIN I (HIGH SENSITIVITY)  TROPONIN I (HIGH SENSITIVITY)    EKG EKG Interpretation  Date/Time:  Sunday November 07 2022 07:34:28 EST Ventricular Rate:  91 PR Interval:  146 QRS Duration: 84 QT Interval:  382 QTC Calculation: 469 R Axis:   75 Text Interpretation: Normal sinus rhythm Normal ECG When compared with ECG of 01-Apr-2022 17:18, No significant change was found Confirmed by Octaviano Glow [phone]) on 11/07/2022 11:44:28 AM  Radiology DG Chest 2 View  Result Date: 11/07/2022 CLINICAL DATA:  41 year old female with chest pain, shortness of breath and dizziness since 2 nights ago. EXAM: CHEST - 2 VIEW COMPARISON:  CT Chest, Abdomen, and Pelvis 02/11/2022 and earlier. FINDINGS: Upright AP and lateral views at 0744 hours. Mild hair artifact. Lung volumes and mediastinal contours remain normal. Visualized tracheal air column is within normal limits. Both lungs are clear. No pneumothorax or pleural effusion. Negative visible bowel gas and osseous structures. IMPRESSION: Negative.  No cardiopulmonary abnormality. Electronically Signed   By: Genevie Ann M.D.   On: 11/07/2022 07:54    Procedures Procedures    Medications Ordered in ED Medications - No data to display  ED Course/ Medical Decision Making/ A&P                           Medical Decision Making Amount and/or Complexity of Data Reviewed Labs: ordered. Radiology: ordered.   Medical Decision Making / ED Course   This patient presents to the ED for concern of chest pain, lightheadedness, elevated blood pressure, this involves an extensive number of treatment options, and is a complaint that carries with it a high risk of complications and morbidity.  The differential diagnosis includes ACS, hypertensive emergency, hypertensive urgency, pneumonia, PE  MDM: 41 year old female presents today for evaluation of chest pain,  lightheadedness, elevated blood pressure.  She had an episode of acute symptoms on Friday.  She is unsure if she lost consciousness but does recall falling to the ground.  Questionable syncopal episode.  Chest pain has been ongoing for quite some time and is intermittent.  Has undergone cardiac work-up previously.  Currently blood pressures are improved in the emergency department.  Work-up reveals CBC which is unremarkable, BMP with glucose of 114 otherwise unremarkable.  Troponin negative x2.  Chest x-ray without acute cardiopulmonary process.  EKG without acute ischemic changes.  She does endorse poor hydration over the past couple weeks.  She does recall feeling lightheaded, warm, and  had a prominent heartbeat but no irregular heart rhythm surrounding the episode. Low suspicion for PE as she is without tachycardia, tachypnea, or hypoxia.  Blood pressure remained without significant elevation throughout the emergency room stay.  We will provide fluid bolus.  Discussed results in the emergency department and plans to have her follow-up with her cardiologist.  She states she will be able to call and get this appointment scheduled tomorrow.  Patient is appropriate for discharge.  Discharged in stable condition.   Additional history obtained: -Additional history obtained from friend who is at bedside -External records from outside source obtained and reviewed including: Chart review including previous notes, labs, imaging, consultation notes   Lab Tests: -I ordered, reviewed, and interpreted labs.   The pertinent results include:   Labs Reviewed  BASIC METABOLIC PANEL - Abnormal; Notable for the following components:      Result Value   CO2 21 (*)    Glucose, Bld 114 (*)    All other components within normal limits  CBC  PREGNANCY, URINE  TROPONIN I (HIGH SENSITIVITY)  TROPONIN I (HIGH SENSITIVITY)      EKG  EKG Interpretation  Date/Time:  Sunday November 07 2022 07:34:28  EST Ventricular Rate:  91 PR Interval:  146 QRS Duration: 84 QT Interval:  382 QTC Calculation: 469 R Axis:   75 Text Interpretation: Normal sinus rhythm Normal ECG When compared with ECG of 01-Apr-2022 17:18, No significant change was found Confirmed by Octaviano Glow [phone]) on 11/07/2022 11:44:28 AM         Imaging Studies ordered: I ordered imaging studies including chest x-ray I independently visualized and interpreted imaging. I agree with the radiologist interpretation   Medicines ordered and prescription drug management: No orders of the defined types were placed in this encounter.   -I have reviewed the patients home medicines and have made adjustments as needed   Cardiac Monitoring: The patient was maintained on a cardiac monitor.  I personally viewed and interpreted the cardiac monitored which showed an underlying rhythm of: Normal sinus rhythm  Co morbidities that complicate the patient evaluation  Past Medical History:  Diagnosis Date   Achilles tendon rupture 05/28/2015   right   Dental crowns present    Uterine cancer [facility])       Dispostion: Patient is appropriate for discharge.  Discharged in stable condition.  Return precautions discussed.    Final Clinical Impression(s) / ED Diagnoses Final diagnoses:  Chest pain, unspecified type  Syncope, unspecified syncope type    Rx / DC Orders ED Discharge Orders     None         Evlyn Courier, PA-C 11/07/22 1309    Wyvonnia Dusky, MD 11/07/22 1504

## 2022-11-07 NOTE — ED Triage Notes (Addendum)
Pt c/o intermittent chest pain, sob, and dizziness since Friday night. Pt states her anxiety has been bad here lately as well

## 2022-11-07 NOTE — ED Notes (Signed)
Provider at bedside

## 2022-11-07 NOTE — ED Provider Triage Note (Signed)
Emergency Medicine Provider Triage Evaluation Note  Diamond Mccoy , a 41 y.o. female  was evaluated in triage.  Pt complains of elevated blood pressure, possible syncopal episode that occurred Friday associated with some chest discomfort.  This lasted 15 minutes.  Chest pain has been ongoing for quite some time even prior to this episode and has been intermittent since this episode as well.  She has had cardiac work-up in the past.  States her systolic was around 094 when this happened.  Reports compliance with amlodipine.  Denies head injury  Review of Systems  Positive: As above Negative: As above  Physical Exam  BP (!) 124/90 (BP Location: Left Arm)   Pulse 88   Temp 98.3 F (36.8 C) (Oral)   Resp 16   LMP 06/11/2015 (Exact Date)   SpO2 100%  Gen:   Awake, no distress   Resp:  Normal effort  MSK:   Moves extremities without difficulty Other:    Medical Decision Making  Medically screening exam initiated at 10:47 AM.  Appropriate orders placed.  Diamond Mccoy was informed that the remainder of the evaluation will be completed by another provider, this initial triage assessment does not replace that evaluation, and the importance of remaining in the ED until their evaluation is complete.     Evlyn Courier, PA-C 11/07/22 1048

## 2022-11-08 ENCOUNTER — Other Ambulatory Visit: Payer: Self-pay | Admitting: Genetic Counselor

## 2022-11-08 DIAGNOSIS — Z803 Family history of malignant neoplasm of breast: Secondary | ICD-10-CM

## 2022-11-10 ENCOUNTER — Other Ambulatory Visit: Payer: Self-pay

## 2022-11-10 ENCOUNTER — Inpatient Hospital Stay: Payer: No Typology Code available for payment source | Attending: Genetic Counselor | Admitting: Genetic Counselor

## 2022-11-10 ENCOUNTER — Encounter: Payer: Self-pay | Admitting: Genetic Counselor

## 2022-11-10 ENCOUNTER — Inpatient Hospital Stay: Payer: No Typology Code available for payment source

## 2022-11-10 DIAGNOSIS — Z800 Family history of malignant neoplasm of digestive organs: Secondary | ICD-10-CM

## 2022-11-10 DIAGNOSIS — Z803 Family history of malignant neoplasm of breast: Secondary | ICD-10-CM

## 2022-11-10 DIAGNOSIS — Z8042 Family history of malignant neoplasm of prostate: Secondary | ICD-10-CM

## 2022-11-10 LAB — GENETIC SCREENING ORDER

## 2022-11-11 ENCOUNTER — Encounter: Payer: Self-pay | Admitting: Genetic Counselor

## 2022-11-11 DIAGNOSIS — Z803 Family history of malignant neoplasm of breast: Secondary | ICD-10-CM | POA: Insufficient documentation

## 2022-11-11 DIAGNOSIS — Z8042 Family history of malignant neoplasm of prostate: Secondary | ICD-10-CM | POA: Insufficient documentation

## 2022-11-11 NOTE — Progress Notes (Addendum)
REFERRING PROVIDER: Administration, [address],  [place] [address]  PRIMARY PROVIDER:  System, Provider Not In  PRIMARY REASON FOR VISIT:  1. Family history of breast cancer   2. Family history of prostate cancer      HISTORY OF PRESENT ILLNESS:   Diamond Mccoy, a 41 y.o. female, was seen for a [place] cancer genetics consultation at the request of the [facility] due to a family history of breast, colon and prostate cancer.  Diamond Mccoy presents to clinic today to discuss the possibility of a hereditary predisposition to cancer, genetic testing, and to further clarify her future cancer risks, as well as potential cancer risks for family members.   Diamond Mccoy is a 41 y.o. female was diagnosed with cervical cancer at age 47-41.  She underwent a complete hysterectomy.    CANCER HISTORY:  Oncology History   No history exists.     RISK FACTORS:  Menarche was at age 23.  First live birth at age N/A.  OCP use for approximately  2-3  years.  Ovaries intact: no.  Hysterectomy: yes.  Menopausal status: postmenopausal.  HRT use: 0 years. Colonoscopy: no; not examined. Mammogram within the last year: yes. Number of breast biopsies: 0. Up to date with pelvic exams: yes. Any excessive radiation exposure in the past: no  Past Medical History:  Diagnosis Date   Achilles tendon rupture 05/28/2015   right   Dental crowns present    Family history of breast cancer    Family history of prostate cancer    Uterine cancer [facility])     Past Surgical History:  Procedure Laterality Date   ACHILLES TENDON SURGERY Right 06/19/2015   Procedure: RIGHT ACHILLES TENDON REPAIR;  Surgeon: Wylene Simmer, MD;  Location: [place];  Service: Orthopedics;  Laterality: Right;   LEEP     TONSILLECTOMY AND ADENOIDECTOMY     UNILATERAL SALPINGECTOMY Left     Social History   Socioeconomic History   Marital status: Married    Spouse name: Not on file    Number of children: Not on file   Years of education: Not on file   Highest education level: Not on file  Occupational History   Not on file  Tobacco Use   Smoking status: Never   Smokeless tobacco: Never  Substance and Sexual Activity   Alcohol use: Yes    Comment: occasionally   Drug use: Yes    Types: Marijuana   Sexual activity: Not on file  Other Topics Concern   Not on file  Social History Narrative   Not on file   Social Determinants of Health   Financial Resource Strain: Not on file  Food Insecurity: Not on file  Transportation Needs: Not on file  Physical Activity: Not on file  Stress: Not on file  Social Connections: Not on file     FAMILY HISTORY:  We obtained a detailed, 4-generation family history.  Significant diagnoses are listed below: Family History  Problem Relation Age of Onset   Cardiomyopathy Mother    Cardiomyopathy Maternal Aunt    Prostate cancer Maternal Uncle    Cardiomyopathy Maternal Uncle    Colon cancer Maternal Uncle    Breast cancer Maternal Grandmother        > 33   Breast cancer Paternal Grandmother        dx > 82   Prostate cancer Paternal Grandfather    Breast cancer Cousin  mat first cousin dx < 42 (31s)   Cardiomyopathy Half-Brother 29     The patient does not have biological children.  She has a maternal half brother who was diagnosed with cardiomyopathy, NOS in his early 71's, had a stent, pacemaker, defibrillator and eventually a transplant.  He died at 5 from rejection of his transplant.  Both parents are living.  The patient's mother also has cardiomyopathy.  She has three brothers and seven sisters.  One sister has cardiomyopathy, a brother had colon cancer and cardiomyopathy and another brother passed aware from prostate cancer.  The maternal grandmother had breast cancer and her brother's daughter also had breast cancer.  The patient's father is living.  He has two sisters and a brother with no reported cancer  history.  His mother had breast cancer and his father had prostate cancer.  Diamond Mccoy is unaware of previous family history of genetic testing for hereditary cancer risks. There is no reported Ashkenazi Jewish ancestry. There is no known consanguinity.  GENETIC COUNSELING ASSESSMENT: Diamond Mccoy is a 41 y.o. female with a family history of breast, colon and prostate cancer which is somewhat suggestive of a hereditary cancer syndrome and predisposition to cancer given the combination of cancer and the young age of onset. We, therefore, discussed and recommended the following at today's visit.   DISCUSSION: We discussed that, in general, most cancer is not inherited in families, but instead is sporadic or familial. Sporadic cancers occur by chance and typically happen at older ages (>50 years) as this type of cancer is caused by genetic changes acquired during an individual's lifetime. Some families have more cancers than would be expected by chance; however, the ages or types of cancer are not consistent with a known genetic mutation or known genetic mutations have been ruled out. This type of familial cancer is thought to be due to a combination of multiple genetic, environmental, hormonal, and lifestyle factors. While this combination of factors likely increases the risk of cancer, the exact source of this risk is not currently identifiable or testable.  We discussed that 5 - 10% of breast cancer is hereditary, with most cases associated with BRCA mutations.  There are other genes that can be associated with hereditary breast cancer syndromes.  These include ATM, CHEK2 and PALB2.  We discussed that testing is beneficial for several reasons including knowing how to follow individuals after completing their treatment, identifying whether potential treatment options such as PARP inhibitors would be beneficial, and understand if other family members could be at risk for cancer and allow them to undergo genetic  testing.   We reviewed the characteristics, features and inheritance patterns of hereditary cancer syndromes. We also discussed genetic testing, including the appropriate family members to test, the process of testing, insurance coverage and turn-around-time for results. We discussed the implications of a negative, positive, carrier and/or variant of uncertain significant result. Diamond Mccoy  was offered a common hereditary cancer panel (47 genes) and an expanded pan-cancer panel (77 genes). Diamond Mccoy was informed of the benefits and limitations of each panel, including that expanded pan-cancer panels contain genes that do not have clear management guidelines at this point in time.  We also discussed that as the number of genes included on a panel increases, the chances of variants of uncertain significance increases. Diamond Mccoy decided to pursue genetic testing for the CancerNext-Expanded+RNAinsight gene panel.   The CancerNext-Expanded gene panel offered by [facility] and includes sequencing and rearrangement analysis  for the following 77 genes: AIP, ALK, APC*, ATM*, AXIN2, BAP1, BARD1, BLM, BMPR1A, BRCA1*, BRCA2*, BRIP1*, CDC73, CDH1*, CDK4, CDKN1B, CDKN2A, CHEK2*, CTNNA1, DICER1, FANCC, FH, FLCN, GALNT12, KIF1B, LZTR1, MAX, MEN1, MET, MLH1*, MSH2*, MSH3, MSH6*, MUTYH*, NBN, NF1*, NF2, NTHL1, PALB2*, PHOX2B, PMS2*, POT1, PRKAR1A, PTCH1, PTEN*, RAD51C*, RAD51D*, RB1, RECQL, RET, SDHA, SDHAF2, SDHB, SDHC, SDHD, SMAD4, SMARCA4, SMARCB1, SMARCE1, STK11, SUFU, TMEM127, TP53*, TSC1, TSC2, VHL and XRCC2 (sequencing and deletion/duplication); EGFR, EGLN1, HOXB13, KIT, MITF, PDGFRA, POLD1, and POLE (sequencing only); EPCAM and GREM1 (deletion/duplication only). DNA and RNA analyses performed for * genes.   We discussed that some people do not want to undergo genetic testing due to fear of genetic discrimination.  A federal law called the Genetic Information Non-Discrimination Act (GINA) of 2008 helps protect  individuals against genetic discrimination based on their genetic test results.  It impacts both health insurance and employment.  With health insurance, it protects against increased premiums, being kicked off insurance or being forced to take a test in order to be insured.  For employment it protects against hiring, firing and promoting decisions based on genetic test results.  GINA does not apply to those in the [employer], those who work for companies with less than 15 employees, and new life insurance or long-term disability insurance policies.  Health status due to a cancer diagnosis is not protected under GINA.   Based on Diamond Mccoy's family history of cancer, she meets medical criteria for genetic testing. Despite that she meets criteria, she may still have an out of pocket cost. We discussed that if her out of pocket cost for testing is over $100, the laboratory will call and confirm whether she wants to proceed with testing.  If the out of pocket cost of testing is less than $100 she will be billed by the genetic testing laboratory.   We also discussed that family history and young onset of cardiomyopathy.  There are several forms of cardiomyopathy, including dilated, hypertrophic and others.  The patient does not know what form her brother or family has.  We discussed that this is something that could be hereditary, and would be important for her brother's children to know about.  Diamond Mccoy would like a referral to the Cardiology genetics program at [facility].  We will let her referring provider know.  PLAN: After considering the risks, benefits, and limitations, Diamond Mccoy provided informed consent to pursue genetic testing and the blood sample was sent to [facility] for analysis of the CancerNext-Expanded+RNAinsight. Results should be available within approximately 2-3 weeks' time, at which point they will be disclosed by telephone to Diamond Mccoy, as will any additional  recommendations warranted by these results. Diamond Mccoy will receive a summary of her genetic counseling visit and a copy of her results once available. This information will also be available in Epic.   Lastly, we encouraged Diamond Mccoy to remain in contact with cancer genetics annually so that we can continuously update the family history and inform her of any changes in cancer genetics and testing that may be of benefit for this family.   Diamond Mccoy were answered to her satisfaction today. Our contact information was provided should additional Mccoy or concerns arise. Thank you for the referral and allowing [place] to share in the care of your patient.   Diamond Mccoy P. Florene Glen, [place], [facility] Licensed, Insurance risk surveyor [email or web address] phone: [phone]  The patient was seen for a total of 40 minutes in face-to-face genetic  counseling.  The patient was seen alone.  Drs. Michell Heinrich, and/or [place] were available for Mccoy, if needed..    _______________________________________________________________________ For Office Staff:  Number of people involved in session: 1 Was an Intern/ student involved with case: no

## 2022-12-02 ENCOUNTER — Ambulatory Visit: Payer: Self-pay | Admitting: Genetic Counselor

## 2022-12-02 ENCOUNTER — Telehealth: Payer: Self-pay | Admitting: Genetic Counselor

## 2022-12-02 ENCOUNTER — Encounter: Payer: Self-pay | Admitting: Genetic Counselor

## 2022-12-02 DIAGNOSIS — Z1379 Encounter for other screening for genetic and chromosomal anomalies: Secondary | ICD-10-CM | POA: Insufficient documentation

## 2022-12-02 NOTE — Progress Notes (Signed)
HPI:  Diamond Mccoy was previously seen in the [place] clinic due to a family history of cancer and concerns regarding a hereditary predisposition to cancer. Please refer to our prior cancer genetics clinic note for more information regarding our discussion, assessment and recommendations, at the time. Diamond Mccoy recent genetic test results were disclosed to her, as were recommendations warranted by these results. These results and recommendations are discussed in more detail below.  CANCER HISTORY:  Oncology History   No history exists.    FAMILY HISTORY:  We obtained a detailed, 4-generation family history.  Significant diagnoses are listed below: Family History  Problem Relation Age of Onset   Cardiomyopathy Mother    Cardiomyopathy Maternal Aunt    Prostate cancer Maternal Uncle    Cardiomyopathy Maternal Uncle    Colon cancer Maternal Uncle    Breast cancer Maternal Grandmother        > 81   Breast cancer Paternal Grandmother        dx > 16   Prostate cancer Paternal Grandfather    Breast cancer Cousin        mat first cousin dx < 55 (46s)   Cardiomyopathy Half-Brother 67    Progress Notes  Diamond Mccoy, Counselor at 11/10/2022  1:00 PM  Status: Addendum  REFERRING PROVIDER: Administration, [address],  [place] [id number]   PRIMARY PROVIDER:  System, Provider Not In   PRIMARY REASON FOR VISIT:  1. Family history of breast cancer   2. Family history of prostate cancer         HISTORY OF PRESENT ILLNESS:   Diamond Mccoy, a 41 y.o. female, was seen for a [place] cancer genetics consultation at the request of the [facility] due to a family history of breast, colon and prostate cancer.  Diamond Mccoy presents to clinic today to discuss the possibility of a hereditary predisposition to cancer, genetic testing, and to further clarify her future cancer risks, as well as potential cancer risks for family members.    [name] is a 41 y.o. female was diagnosed with cervical cancer at age 40-41.  She underwent a complete hysterectomy.     CANCER HISTORY:     Oncology History    No history exists.        RISK FACTORS:  Menarche was at age 79.  First live birth at age N/A.  OCP use for approximately  2-3  years.  Ovaries intact: no.  Hysterectomy: yes.  Menopausal status: postmenopausal.  HRT use: 0 years. Colonoscopy: no; not examined. Mammogram within the last year: yes. Number of breast biopsies: 0. Up to date with pelvic exams: yes. Any excessive radiation exposure in the past: no       Past Medical History:  Diagnosis Date   Achilles tendon rupture 05/28/2015    right   Dental crowns present     Family history of breast cancer     Family history of prostate cancer     Uterine cancer [facility])             Past Surgical History:  Procedure Laterality Date   ACHILLES TENDON SURGERY Right 06/19/2015    Procedure: RIGHT ACHILLES TENDON REPAIR;  Surgeon: Wylene Simmer, MD;  Location: [place];  Service: Orthopedics;  Laterality: Right;   LEEP       TONSILLECTOMY AND ADENOIDECTOMY       UNILATERAL SALPINGECTOMY Left  Social History         Socioeconomic History   Marital status: Married      Spouse name: Not on file   Number of children: Not on file   Years of education: Not on file   Highest education level: Not on file  Occupational History   Not on file  Tobacco Use   Smoking status: Never   Smokeless tobacco: Never  Substance and Sexual Activity   Alcohol use: Yes      Comment: occasionally   Drug use: Yes      Types: Marijuana   Sexual activity: Not on file  Other Topics Concern   Not on file  Social History Narrative   Not on file    Social Determinants of Health    Financial Resource Strain: Not on file  Food Insecurity: Not on file  Transportation Needs: Not on file  Physical Activity: Not on file  Stress: Not on file  Social Connections:  Not on file      FAMILY HISTORY:  We obtained a detailed, 4-generation family history.  Significant diagnoses are listed below:      Family History  Problem Relation Age of Onset   Cardiomyopathy Mother     Cardiomyopathy Maternal Aunt     Prostate cancer Maternal Uncle     Cardiomyopathy Maternal Uncle     Colon cancer Maternal Uncle     Breast cancer Maternal Grandmother          > 47   Breast cancer Paternal Grandmother          dx > 60   Prostate cancer Paternal Grandfather     Breast cancer Cousin          mat first cousin dx < 27 (43s)   Cardiomyopathy Half-Brother 92       The patient does not have biological children.  She has a maternal half brother who was diagnosed with cardiomyopathy, NOS in his early 29's, had a stent, pacemaker, defibrillator and eventually a transplant.  He died at 72 from rejection of his transplant.  Both parents are living.   The patient's mother also has cardiomyopathy.  She has three brothers and seven sisters.  One sister has cardiomyopathy, a brother had colon cancer and cardiomyopathy and another brother passed aware from prostate cancer.  The maternal grandmother had breast cancer and her brother's daughter also had breast cancer.   The patient's father is living.  He has two sisters and a brother with no reported cancer history.  His mother had breast cancer and his father had prostate cancer.   Diamond Mccoy is unaware of previous family history of genetic testing for hereditary cancer risks. There is no reported Ashkenazi Jewish ancestry. There is no known consanguinity      GENETIC TEST RESULTS: Genetic testing reported out on November 24, 2022 through the CancerNext-Expanded+RNAinsight cancer panel found no pathogenic mutations. The CancerNext-Expanded gene panel offered by [facility] and includes sequencing and rearrangement analysis for the following 77 genes: AIP, ALK, APC*, ATM*, AXIN2, BAP1, BARD1, BLM, BMPR1A, BRCA1*, BRCA2*,  BRIP1*, CDC73, CDH1*, CDK4, CDKN1B, CDKN2A, CHEK2*, CTNNA1, DICER1, FANCC, FH, FLCN, GALNT12, KIF1B, LZTR1, MAX, MEN1, MET, MLH1*, MSH2*, MSH3, MSH6*, MUTYH*, NBN, NF1*, NF2, NTHL1, PALB2*, PHOX2B, PMS2*, POT1, PRKAR1A, PTCH1, PTEN*, RAD51C*, RAD51D*, RB1, RECQL, RET, SDHA, SDHAF2, SDHB, SDHC, SDHD, SMAD4, SMARCA4, SMARCB1, SMARCE1, STK11, SUFU, TMEM127, TP53*, TSC1, TSC2, VHL and XRCC2 (sequencing and deletion/duplication); EGFR, EGLN1, HOXB13, KIT, MITF, PDGFRA, POLD1,  and POLE (sequencing only); EPCAM and GREM1 (deletion/duplication only). DNA and RNA analyses performed for * genes. . The test report has been scanned into EPIC and is located under the Molecular Pathology section of the Results Review tab.  A portion of the result report is included below for reference.     We discussed with Diamond Mccoy that because current genetic testing is not perfect, it is possible there may be a gene mutation in one of these genes that current testing cannot detect, but that chance is small.  We also discussed, that there could be another gene that has not yet been discovered, or that we have not yet tested, that is responsible for the cancer diagnoses in the family. It is also possible there is a hereditary cause for the cancer in the family that Diamond Mccoy did not inherit and therefore was not identified in her testing.  Therefore, it is important to remain in touch with cancer genetics in the future so that we can continue to offer Diamond Mccoy the most up to date genetic testing.   ADDITIONAL GENETIC TESTING: We discussed with Diamond Mccoy that her genetic testing was fairly extensive.  If there are genes identified to increase cancer risk that can be analyzed in the future, we would be happy to discuss and coordinate this testing at that time.    CANCER SCREENING RECOMMENDATIONS: Diamond Mccoy test result is considered negative (normal).  This means that we have not identified a hereditary cause for her family history  of cancer at this time. Most cancers happen by chance and this negative test suggests that her cancer may fall into this category.    While reassuring, this does not definitively rule out a hereditary predisposition to cancer. It is still possible that there could be genetic mutations that are undetectable by current technology. There could be genetic mutations in genes that have not been tested or identified to increase cancer risk.  Therefore, it is recommended she continue to follow the cancer management and screening guidelines provided by her primary healthcare provider.   An individual's cancer risk and medical management are not determined by genetic test results alone. Overall cancer risk assessment incorporates additional factors, including personal medical history, family history, and any available genetic information that may result in a personalized plan for cancer prevention and surveillance  RECOMMENDATIONS FOR FAMILY MEMBERS:  Individuals in this family might be at some increased risk of developing cancer, over the general population risk, simply due to the family history of cancer.  We recommended women in this family have a yearly mammogram beginning at age 37, or 7 years younger than the earliest onset of cancer, an annual clinical breast exam, and perform monthly breast self-exams. Women in this family should also have a gynecological exam as recommended by their primary provider. All family members should be referred for colonoscopy starting at age 83.  It is also possible there is a hereditary cause for the cardiomyopathy in Diamond Mccoy family.  Based on Diamond Mccoy's family history, we recommended that she be referred for genetic counseling for her family history of cardiomyopathy.     FOLLOW-UP: Lastly, we discussed with Diamond Mccoy that cancer genetics is a rapidly advancing field and it is possible that new genetic tests will be appropriate for her and/or her family members in the  future. We encouraged her to remain in contact with cancer genetics on an annual basis so we can update her personal and family histories  and let her know of advances in cancer genetics that may benefit this family.   Our contact number was provided. Diamond Mccoy questions were answered to her satisfaction, and she knows she is welcome to call us at anytime with additional questions or concerns.   Roma Kayser, [place], [facility] Licensed, Certified Genetic Counselor [email or web address]

## 2022-12-02 NOTE — Telephone Encounter (Signed)
Revealed negative genetic testing.  Discussed that we do not know why there is cancer in the family. It could be due to a different gene that we are not testing, or maybe our current technology may not be able to pick something up.  It will be important for her to keep in contact with genetics to keep up with whether additional testing may be needed.  

## 2024-01-01 ENCOUNTER — Encounter (HOSPITAL_COMMUNITY): Payer: Self-pay

## 2024-01-01 ENCOUNTER — Other Ambulatory Visit: Payer: Self-pay

## 2024-01-01 ENCOUNTER — Emergency Department (HOSPITAL_COMMUNITY)
Admission: EM | Admit: 2024-01-01 | Discharge: 2024-01-01 | Disposition: A | Discharge status: Home / Self Care | Payer: No Typology Code available for payment source | Attending: Emergency Medicine | Admitting: Emergency Medicine

## 2024-01-01 DIAGNOSIS — F12121 Cannabis abuse with intoxication delirium: Secondary | ICD-10-CM | POA: Diagnosis not present

## 2024-01-01 DIAGNOSIS — Z7982 Long term (current) use of aspirin: Secondary | ICD-10-CM | POA: Insufficient documentation

## 2024-01-01 DIAGNOSIS — F12921 Cannabis use, unspecified with intoxication delirium: Secondary | ICD-10-CM

## 2024-01-01 DIAGNOSIS — Z8541 Personal history of malignant neoplasm of cervix uteri: Secondary | ICD-10-CM | POA: Diagnosis not present

## 2024-01-01 DIAGNOSIS — R451 Restlessness and agitation: Secondary | ICD-10-CM | POA: Diagnosis present

## 2024-01-01 LAB — COMPREHENSIVE METABOLIC PANEL
ALT: 21 U/L (ref 0–44)
AST: 36 U/L (ref 15–41)
Albumin: 4.4 g/dL (ref 3.5–5.0)
Alkaline Phosphatase: 48 U/L (ref 38–126)
Anion gap: 9 (ref 5–15)
BUN: 9 mg/dL (ref 6–20)
CO2: 23 mmol/L (ref 22–32)
Calcium: 8.9 mg/dL (ref 8.9–10.3)
Chloride: 102 mmol/L (ref 98–111)
Creatinine, Ser: 0.68 mg/dL (ref 0.44–1.00)
GFR, Estimated: >60 mL/min (ref >60)
Glucose, Bld: 122 mg/dL — ABNORMAL HIGH (ref 70–99)
Potassium: 3.5 mmol/L (ref 3.5–5.1)
Sodium: 134 mmol/L — ABNORMAL LOW (ref 135–145)
Total Bilirubin: 0.7 mg/dL (ref 0.0–1.2)
Total Protein: 7.5 g/dL (ref 6.5–8.1)

## 2024-01-01 LAB — CBC WITH DIFFERENTIAL/PLATELET
Abs Immature Granulocytes: 0.01 10*3/uL (ref 0.00–0.07)
Basophils Absolute: 0 10*3/uL (ref 0.0–0.1)
Basophils Relative: 0 %
Eosinophils Absolute: 0 10*3/uL (ref 0.0–0.5)
Eosinophils Relative: 1 %
HCT: 36.5 % (ref 36.0–46.0)
Hemoglobin: 11.9 g/dL — ABNORMAL LOW (ref 12.0–15.0)
Immature Granulocytes: 0 %
Lymphocytes Relative: 29 %
Lymphs Abs: 1.5 10*3/uL (ref 0.7–4.0)
MCH: 27.5 pg (ref 26.0–34.0)
MCHC: 32.6 g/dL (ref 30.0–36.0)
MCV: 84.5 fL (ref 80.0–100.0)
Monocytes Absolute: 0.3 10*3/uL (ref 0.1–1.0)
Monocytes Relative: 6 %
Neutro Abs: 3.3 10*3/uL (ref 1.7–7.7)
Neutrophils Relative %: 64 %
Platelets: 219 10*3/uL (ref 150–400)
RBC: 4.32 MIL/uL (ref 3.87–5.11)
RDW: 15.9 % — ABNORMAL HIGH (ref 11.5–15.5)
WBC: 5.2 10*3/uL (ref 4.0–10.5)
nRBC: 0 % (ref 0.0–0.2)

## 2024-01-01 LAB — ETHANOL: Alcohol, Ethyl (B): <10 mg/dL (ref <10)

## 2024-01-01 MED ORDER — HALOPERIDOL LACTATE 5 MG/ML IJ SOLN: 5.0000 mg | Freq: Once | INTRAMUSCULAR | Status: AC

## 2024-01-01 MED ORDER — LORAZEPAM 2 MG/ML IJ SOLN: 2.0000 mg | Freq: Once | INTRAMUSCULAR | Status: AC

## 2024-01-01 MED ORDER — LORAZEPAM 2 MG/ML IJ SOLN
INTRAMUSCULAR | Status: AC
  Administered 2024-01-01: 2 mg via INTRAMUSCULAR
  Filled 2024-01-01: qty 1

## 2024-01-01 MED ORDER — ZIPRASIDONE MESYLATE 20 MG IM SOLR
10.0000 mg | Freq: Once | INTRAMUSCULAR | Status: DC
  Filled 2024-01-01: qty 20

## 2024-01-01 MED ORDER — HALOPERIDOL LACTATE 5 MG/ML IJ SOLN
INTRAMUSCULAR | Status: AC
  Administered 2024-01-01: 5 mg via INTRAMUSCULAR
  Filled 2024-01-01: qty 1

## 2024-01-01 NOTE — ED Notes (Signed)
 Pt comfortable, still sleeping

## 2024-01-01 NOTE — ED Notes (Signed)
Dr. Cardama at bedside.  

## 2024-01-01 NOTE — ED Notes (Signed)
 Assumed care of patient. Patient currently sleeping, no signs of acute distress noted. IVC being rescinded.

## 2024-01-01 NOTE — ED Provider Notes (Deleted)
  I was called to bedside by ED RN with concern for patient becoming agitated, aggressive, physically violent towards ED staff and [facility] please officers.  Patient will require sedation for her safety and the safety of ED staff, [place]  ordered.  Prior EKG reviewed, patient does not history of prolonged Qtc.  This chart was dictated using voice recognition software, Dragon. Despite the best efforts of this provider to proofread and correct errors, errors may still occur which can change documentation meaning.    Bobette Pleasant SAUNDERS, PA-C 01/01/24 [phone]

## 2024-01-01 NOTE — ED Notes (Signed)
 Pt became violent with staff. Pt began pacing in hallway, making threatening gestures to staff members. Pt became violent when restrained by [employer] in hospital.

## 2024-01-01 NOTE — ED Triage Notes (Signed)
 Arrives via [employer] with aggressive behaviors after drug use. Pt noncompliant with staff or Designer, television/film set.
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
## 2024-01-01 NOTE — ED Notes (Signed)
 Pt still aggressive with pd. Pt constantly asking for phone and yelling

## 2024-01-27 ENCOUNTER — Other Ambulatory Visit: Payer: Self-pay

## 2024-01-27 ENCOUNTER — Encounter (HOSPITAL_COMMUNITY): Payer: Self-pay

## 2024-01-27 ENCOUNTER — Emergency Department (HOSPITAL_COMMUNITY)
Admission: EM | Admit: 2024-01-27 | Discharge: 2024-02-01 | Disposition: A | Discharge status: Other Acute Inpatient Hospital | Payer: Non-veteran care | Attending: Emergency Medicine | Admitting: Emergency Medicine

## 2024-01-27 DIAGNOSIS — F29 Unspecified psychosis not due to a substance or known physiological condition: Secondary | ICD-10-CM | POA: Insufficient documentation

## 2024-01-27 DIAGNOSIS — Z8541 Personal history of malignant neoplasm of cervix uteri: Secondary | ICD-10-CM | POA: Diagnosis not present

## 2024-01-27 DIAGNOSIS — Z7982 Long term (current) use of aspirin: Secondary | ICD-10-CM | POA: Insufficient documentation

## 2024-01-27 DIAGNOSIS — F22 Delusional disorders: Secondary | ICD-10-CM | POA: Diagnosis not present

## 2024-01-27 DIAGNOSIS — Z1152 Encounter for screening for COVID-19: Secondary | ICD-10-CM | POA: Insufficient documentation

## 2024-01-27 DIAGNOSIS — R4689 Other symptoms and signs involving appearance and behavior: Secondary | ICD-10-CM | POA: Insufficient documentation

## 2024-01-27 DIAGNOSIS — F259 Schizoaffective disorder, unspecified: Secondary | ICD-10-CM | POA: Diagnosis not present

## 2024-01-27 LAB — COMPREHENSIVE METABOLIC PANEL
ALT: 29 U/L (ref 0–44)
AST: 36 U/L (ref 15–41)
Albumin: 5 g/dL (ref 3.5–5.0)
Alkaline Phosphatase: 49 U/L (ref 38–126)
Anion gap: 13 (ref 5–15)
BUN: 16 mg/dL (ref 6–20)
CO2: 23 mmol/L (ref 22–32)
Calcium: 10 mg/dL (ref 8.9–10.3)
Chloride: 104 mmol/L (ref 98–111)
Creatinine, Ser: 0.86 mg/dL (ref 0.44–1.00)
GFR, Estimated: >60 mL/min (ref >60)
Glucose, Bld: 111 mg/dL — ABNORMAL HIGH (ref 70–99)
Potassium: 3.8 mmol/L (ref 3.5–5.1)
Sodium: 140 mmol/L (ref 135–145)
Total Bilirubin: 0.8 mg/dL (ref 0.0–1.2)
Total Protein: 8.1 g/dL (ref 6.5–8.1)

## 2024-01-27 LAB — CBC WITH DIFFERENTIAL/PLATELET
Abs Immature Granulocytes: 0 10*3/uL (ref 0.00–0.07)
Basophils Absolute: 0 10*3/uL (ref 0.0–0.1)
Basophils Relative: 1 %
Eosinophils Absolute: 0 10*3/uL (ref 0.0–0.5)
Eosinophils Relative: 1 %
HCT: 41.5 % (ref 36.0–46.0)
Hemoglobin: 13.2 g/dL (ref 12.0–15.0)
Immature Granulocytes: 0 %
Lymphocytes Relative: 60 %
Lymphs Abs: 2.6 10*3/uL (ref 0.7–4.0)
MCH: 27.1 pg (ref 26.0–34.0)
MCHC: 31.8 g/dL (ref 30.0–36.0)
MCV: 85.2 fL (ref 80.0–100.0)
Monocytes Absolute: 0.5 10*3/uL (ref 0.1–1.0)
Monocytes Relative: 12 %
Neutro Abs: 1.1 10*3/uL — ABNORMAL LOW (ref 1.7–7.7)
Neutrophils Relative %: 26 %
Platelets: 219 10*3/uL (ref 150–400)
RBC: 4.87 MIL/uL (ref 3.87–5.11)
RDW: 14.8 % (ref 11.5–15.5)
WBC: 4.3 10*3/uL (ref 4.0–10.5)
nRBC: 0 % (ref 0.0–0.2)

## 2024-01-27 LAB — ACETAMINOPHEN LEVEL: Acetaminophen (Tylenol), Serum: <10 ug/mL — ABNORMAL LOW (ref 10–30)

## 2024-01-27 LAB — ETHANOL: Alcohol, Ethyl (B): <10 mg/dL (ref <10)

## 2024-01-27 LAB — SALICYLATE LEVEL: Salicylate Lvl: <7 mg/dL — ABNORMAL LOW (ref 7.0–30.0)

## 2024-01-27 MED ORDER — ZIPRASIDONE MESYLATE 20 MG IM SOLR
20.0000 mg | Freq: Once | INTRAMUSCULAR | Status: AC
  Administered 2024-01-27: 20 mg via INTRAMUSCULAR
  Filled 2024-01-27: qty 20

## 2024-01-27 NOTE — ED Notes (Addendum)
Belongings placed in locker 35 by [employer].
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
## 2024-01-27 NOTE — ED Notes (Signed)
Patient bending over in room repeatedly, keeps repeating every thing said by staff and office, patient pushes code button in room, standing in corner talking to self.   Meds admin

## 2024-01-27 NOTE — ED Provider Notes (Signed)
[place] EMERGENCY DEPARTMENT AT [facility] Provider Note   CSN: [id number] Arrival date & time: 01/27/24  1844     History  Chief Complaint  Patient presents with   Psychiatric Evaluation    Diamond Mccoy is a 43 y.o. female.  Patient presents to the emergency department in law enforcement custody under involuntary commitment.  Patient is currently incarcerated and has been reportedly seeing and speaking to God and her cell.  She has been sitting naked in her cell and has been aggressive including flooding her cell.  She has also reportedly not been eating and has not been sleeping for a few days.  Patient was administered Geodon prior to my assessment due to aggressive behavior and was unable to participate in history.  HPI     Home Medications Prior to Admission medications   Medication Sig Start Date End Date Taking? Authorizing Provider  aspirin EC 325 MG tablet Take 1 tablet (325 mg total) by mouth daily. 06/19/15   Jacinta Shoe, PA-C  Biotin 1000 MCG tablet Take 1,000 mcg by mouth 3 (three) times daily.    [provider]  docusate sodium (COLACE) 100 MG capsule Take 1 capsule (100 mg total) by mouth 2 (two) times daily. While taking narcotic pain medicine. 06/19/15   Jacinta Shoe, PA-C  LORazepam (ATIVAN) 1 MG tablet Take 1 tablet (1 mg total) by mouth 3 (three) times daily as needed for anxiety. 04/02/22   Mesner, Barbara Cower, MD  oxyCODONE (ROXICODONE) 5 MG immediate release tablet Take 1-2 tablets (5-10 mg total) by mouth every 4 (four) hours as needed for moderate pain or severe pain. 06/19/15   Jacinta Shoe, PA-C  senna (SENOKOT) 8.6 MG TABS tablet Take 2 tablets (17.2 mg total) by mouth 2 (two) times daily. 06/19/15   Jacinta Shoe, PA-C      Allergies    Shrimp extract and Shrimp (diagnostic)    Review of Systems   Review of Systems  Physical Exam Updated Vital Signs BP (!) 125/91   Pulse (!) 105   Temp 98 F (36.7 C)    Resp 20   Ht 5\' 3"  (1.6 m)   Wt 90 kg   LMP 06/11/2015 (Exact Date)   SpO2 100%   BMI 35.15 kg/m  Physical Exam Vitals and nursing note reviewed.  HENT:     Head: Normocephalic and atraumatic.  Eyes:     Conjunctiva/sclera: Conjunctivae normal.  Cardiovascular:     Rate and Rhythm: Normal rate and regular rhythm.  Pulmonary:     Effort: Pulmonary effort is normal.     Breath sounds: Normal breath sounds.  Musculoskeletal:        General: No signs of injury.     Cervical back: Normal range of motion.  Skin:    General: Skin is dry.  Neurological:     Mental Status: She is alert.  Psychiatric:        Speech: Speech normal.     ED Results / Procedures / Treatments   Labs (all labs ordered are listed, but only abnormal results are displayed) Labs Reviewed  COMPREHENSIVE METABOLIC PANEL - Abnormal; Notable for the following components:      Result Value   Glucose, Bld 111 (*)    All other components within normal limits  CBC WITH DIFFERENTIAL/PLATELET - Abnormal; Notable for the following components:   Neutro Abs 1.1 (*)    All other components within normal limits  ACETAMINOPHEN  LEVEL - Abnormal; Notable for the following components:   Acetaminophen (Tylenol), Serum <10 (*)    All other components within normal limits  SALICYLATE LEVEL - Abnormal; Notable for the following components:   Salicylate Lvl <7.0 (*)    All other components within normal limits  ETHANOL  RAPID URINE DRUG SCREEN, HOSP PERFORMED    EKG None  Radiology No results found.  Procedures .Critical Care  Performed by: Darrick Grinder, PA-C Authorized by: Darrick Grinder, PA-C   Critical care provider statement:    Critical care time (minutes):  34   Critical care was necessary to treat or prevent imminent or life-threatening deterioration of the following conditions: Patient chemically restrained.   Critical care was time spent personally by me on the following activities:  Development  of treatment plan with patient or surrogate, discussions with consultants, evaluation of patient's response to treatment, examination of patient, ordering and review of laboratory studies, ordering and review of radiographic studies, ordering and performing treatments and interventions, pulse oximetry, re-evaluation of patient's condition and review of old charts     Medications Ordered in ED Medications  ziprasidone (GEODON) injection 20 mg (20 mg Intramuscular Given 01/27/24 2010)    ED Course/ Medical Decision Making/ A&P                                 Medical Decision Making  This patient presents to the ED for concern of abnormal behavior, this involves an extensive number of treatment options, and is a complaint that carries with it a high risk of complications and morbidity.  The differential diagnosis includes psychosis, aggression, substance abuse, others   Co morbidities that complicate the patient evaluation  Uterine cancer   Additional history obtained:  Additional history obtained from law enforcement External records from outside source obtained and reviewed including previous notes showing visit due to acute hyperactive cannabis intoxication delirium   Lab Tests:  I Ordered, and personally interpreted labs.  The pertinent results include: Grossly unremarkable CMP, CBC, ethanol level, salicylate level, acetaminophen level.  UDS pending    Cardiac Monitoring: / EKG:  The patient was maintained on a cardiac monitor.  I personally viewed and interpreted the cardiac monitored which showed an underlying rhythm of: normal sinus rhythm   Consultations Obtained:  I requested consultation with TTS for psychiatric evaluation   Problem List / ED Course / Critical interventions / Medication management   I ordered medication including Geodon for chemical sedation Reevaluation of the patient after these medicines showed that the patient improved I have reviewed the  patients home medicines and have made adjustments as needed   Social Determinants of Health:  Patient is currently intoxicated   Test / Admission - Considered:  Patient was acting aggressively upon arrival and had to be chemically restrained with use of Geodon.  Patient is under involuntary commitment from law enforcement due to abnormal behavior which sounds consistent with possible psychosis.  UDS is pending at this time.  Patient is cleared medically for evaluation by psychiatry.         Final Clinical Impression(s) / ED Diagnoses Final diagnoses:  Psychosis, unspecified psychosis type [facility])    Rx / DC Orders ED Discharge Orders     None         Pamala Duffel 01/28/24 0023    Sabas Sous, MD 01/28/24 [phone]

## 2024-01-27 NOTE — ED Provider Notes (Incomplete)
[place] EMERGENCY DEPARTMENT AT [facility] Provider Note   CSN: [id number] Arrival date & time: 01/27/24  1844     History {Add pertinent medical, surgical, social history, OB history to HPI:1} Chief Complaint  Patient presents with  . Psychiatric Evaluation    Diamond Mccoy is a 43 y.o. female.  Patient presents to the emergency department in law enforcement custody under involuntary commitment.  Patient is currently incarcerated and has been reportedly seeing and speaking to God and her cell.  She has been sitting naked in her cell and has been aggressive including flooding her cell.  She has also reportedly not been eating and has not been sleeping for a few days.  Patient was administered Geodon prior to my assessment due to aggressive behavior and was unable to participate in history.  HPI     Home Medications Prior to Admission medications   Medication Sig Start Date End Date Taking? Authorizing Provider  aspirin EC 325 MG tablet Take 1 tablet (325 mg total) by mouth daily. 06/19/15   Jacinta Shoe, PA-C  Biotin 1000 MCG tablet Take 1,000 mcg by mouth 3 (three) times daily.    [provider]  docusate sodium (COLACE) 100 MG capsule Take 1 capsule (100 mg total) by mouth 2 (two) times daily. While taking narcotic pain medicine. 06/19/15   Jacinta Shoe, PA-C  LORazepam (ATIVAN) 1 MG tablet Take 1 tablet (1 mg total) by mouth 3 (three) times daily as needed for anxiety. 04/02/22   Mesner, Barbara Cower, MD  oxyCODONE (ROXICODONE) 5 MG immediate release tablet Take 1-2 tablets (5-10 mg total) by mouth every 4 (four) hours as needed for moderate pain or severe pain. 06/19/15   Jacinta Shoe, PA-C  senna (SENOKOT) 8.6 MG TABS tablet Take 2 tablets (17.2 mg total) by mouth 2 (two) times daily. 06/19/15   Jacinta Shoe, PA-C      Allergies    Shrimp extract and Shrimp (diagnostic)    Review of Systems   Review of Systems  Physical Exam Updated  Vital Signs BP (!) 125/91   Pulse (!) 105   Temp 98 F (36.7 C)   Resp 20   Ht 5\' 3"  (1.6 m)   Wt 90 kg   LMP 06/11/2015 (Exact Date)   SpO2 100%   BMI 35.15 kg/m  Physical Exam Vitals and nursing note reviewed.  HENT:     Head: Normocephalic and atraumatic.  Eyes:     Conjunctiva/sclera: Conjunctivae normal.  Cardiovascular:     Rate and Rhythm: Normal rate and regular rhythm.  Pulmonary:     Effort: Pulmonary effort is normal.     Breath sounds: Normal breath sounds.  Musculoskeletal:        General: No signs of injury.     Cervical back: Normal range of motion.  Skin:    General: Skin is dry.  Neurological:     Mental Status: She is alert.  Psychiatric:        Speech: Speech normal.     ED Results / Procedures / Treatments   Labs (all labs ordered are listed, but only abnormal results are displayed) Labs Reviewed  COMPREHENSIVE METABOLIC PANEL - Abnormal; Notable for the following components:      Result Value   Glucose, Bld 111 (*)    All other components within normal limits  CBC WITH DIFFERENTIAL/PLATELET - Abnormal; Notable for the following components:   Neutro Abs 1.1 (*)  All other components within normal limits  ACETAMINOPHEN LEVEL - Abnormal; Notable for the following components:   Acetaminophen (Tylenol), Serum <10 (*)    All other components within normal limits  SALICYLATE LEVEL - Abnormal; Notable for the following components:   Salicylate Lvl <7.0 (*)    All other components within normal limits  ETHANOL  RAPID URINE DRUG SCREEN, HOSP PERFORMED    EKG None  Radiology No results found.  Procedures .Critical Care  Performed by: Darrick Grinder, PA-C Authorized by: Darrick Grinder, PA-C   Critical care provider statement:    Critical care time (minutes):  34   Critical care was necessary to treat or prevent imminent or life-threatening deterioration of the following conditions: Patient chemically restrained.   Critical care  was time spent personally by me on the following activities:  Development of treatment plan with patient or surrogate, discussions with consultants, evaluation of patient's response to treatment, examination of patient, ordering and review of laboratory studies, ordering and review of radiographic studies, ordering and performing treatments and interventions, pulse oximetry, re-evaluation of patient's condition and review of old charts   {Document cardiac monitor, telemetry assessment procedure when appropriate:1}  Medications Ordered in ED Medications  ziprasidone (GEODON) injection 20 mg (20 mg Intramuscular Given 01/27/24 2010)    ED Course/ Medical Decision Making/ A&P   {   Click here for ABCD2, HEART and other calculatorsREFRESH Note before signing :1}                              Medical Decision Making  This patient presents to the ED for concern of abnormal behavior, this involves an extensive number of treatment options, and is a complaint that carries with it a high risk of complications and morbidity.  The differential diagnosis includes psychosis, aggression, substance abuse, others   Co morbidities that complicate the patient evaluation  Uterine cancer   Additional history obtained:  Additional history obtained from law enforcement External records from outside source obtained and reviewed including previous notes showing visit due to acute hyperactive cannabis intoxication delirium   Lab Tests:  I Ordered, and personally interpreted labs.  The pertinent results include: Grossly unremarkable CMP, CBC, ethanol level, salicylate level, acetaminophen level.  UDS pending    Cardiac Monitoring: / EKG:  The patient was maintained on a cardiac monitor.  I personally viewed and interpreted the cardiac monitored which showed an underlying rhythm of: ***   Consultations Obtained:  I requested consultation with TTS for psychiatric evaluation   Problem List / ED Course  / Critical interventions / Medication management   I ordered medication including Geodon for chemical sedation Reevaluation of the patient after these medicines showed that the patient improved I have reviewed the patients home medicines and have made adjustments as needed   Social Determinants of Health:  Patient is currently intoxicated   Test / Admission - Considered:  Patient was acting aggressively upon arrival and had to be chemically restrained with use of Geodon.  Patient is under involuntary commitment from law enforcement due to abnormal behavior which sounds consistent with possible psychosis.  UDS is pending at this time.  Patient is cleared medically for evaluation by psychiatry.   {Document critical care time when appropriate:1} {Document review of labs and clinical decision tools ie heart score, Chads2Vasc2 etc:1}  {Document your independent review of radiology images, and any outside records:1} {Document your discussion with family  members, caretakers, and with consultants:1} {Document social determinants of health affecting pt's care:1} {Document your decision making why or why not admission, treatments were needed:1} Final Clinical Impression(s) / ED Diagnoses Final diagnoses:  None    Rx / DC Orders ED Discharge Orders     None

## 2024-01-28 DIAGNOSIS — F22 Delusional disorders: Secondary | ICD-10-CM | POA: Diagnosis not present

## 2024-01-28 DIAGNOSIS — F29 Unspecified psychosis not due to a substance or known physiological condition: Secondary | ICD-10-CM

## 2024-01-28 DIAGNOSIS — R4689 Other symptoms and signs involving appearance and behavior: Secondary | ICD-10-CM

## 2024-01-28 MED ORDER — ZIPRASIDONE MESYLATE 20 MG IM SOLR
INTRAMUSCULAR | Status: AC
  Filled 2024-01-28: qty 20

## 2024-01-28 MED ORDER — ZIPRASIDONE MESYLATE 20 MG IM SOLR
20.0000 mg | Freq: Once | INTRAMUSCULAR | Status: AC
  Administered 2024-01-28: 20 mg via INTRAMUSCULAR
  Filled 2024-01-28: qty 20

## 2024-01-28 MED ORDER — STERILE WATER FOR INJECTION IJ SOLN
INTRAMUSCULAR | Status: AC
  Administered 2024-01-28: 1.2 mL
  Filled 2024-01-28: qty 10

## 2024-01-28 MED ORDER — LORAZEPAM 2 MG/ML IJ SOLN
2.0000 mg | Freq: Four times a day (QID) | INTRAMUSCULAR | Status: DC | PRN
  Administered 2024-01-30 – 2024-02-01 (×2): 2 mg via INTRAMUSCULAR
  Filled 2024-01-28 (×2): qty 1

## 2024-01-28 MED ORDER — LORAZEPAM 1 MG PO TABS
2.0000 mg | ORAL_TABLET | Freq: Four times a day (QID) | ORAL | Status: DC | PRN
  Administered 2024-01-31: 2 mg via ORAL
  Filled 2024-01-28: qty 2

## 2024-01-28 MED ORDER — HALOPERIDOL LACTATE 5 MG/ML IJ SOLN
5.0000 mg | Freq: Once | INTRAMUSCULAR | Status: AC | PRN
  Administered 2024-01-29: 5 mg via INTRAMUSCULAR
  Filled 2024-01-28: qty 1

## 2024-01-28 MED ORDER — OLANZAPINE 5 MG PO TABS: 5.0000 mg | ORAL_TABLET | Freq: Three times a day (TID) | ORAL | Status: DC | PRN

## 2024-01-28 MED ORDER — MIDAZOLAM HCL 2 MG/2ML IJ SOLN
4.0000 mg | Freq: Once | INTRAMUSCULAR | Status: AC | PRN
  Administered 2024-01-29: 4 mg via INTRAMUSCULAR
  Filled 2024-01-28: qty 4
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
## 2024-01-28 NOTE — Progress Notes (Signed)
CSW sent [facility] a BH referral for possible acceptance. CSW will continue to seek recommended disposition.    Damita Dunnings, MSW, LCSW-A  6:08 PM 01/28/2024
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
## 2024-01-28 NOTE — ED Notes (Signed)
Patient is alert. Has been cooperative with care this shift. Patient has mostly slept this shift.  Patient is now restless. Pressured speech.

## 2024-01-28 NOTE — BH Assessment (Addendum)
Patient was given 20mg  Geodon IM at 20:10.  This clinician checked with paramedic [facility] and she confirmed that patient was too sleepy to be teleassessed at this time.  TTS will check in later to see if patient is more alert.
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
## 2024-01-28 NOTE — ED Notes (Signed)
DO NOT GO INTO ROOM WITHOUT OFFICER OR SECUIRTY!!!
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
## 2024-01-29 NOTE — Progress Notes (Signed)
LCSW Progress Note  [id number]   Diamond Mccoy  01/29/2024  4:24 PM    Inpatient Behavioral Health Placement  Pt meets inpatient criteria per Sindy Guadeloupe, NP. There are no available beds within [facility] system per [facility] AC Linsey Strader,RN. Referral was sent to the following facilities;   Destination  Service Provider Address Phone [facility] [address] [place], [place] [address] [phone]  [facility] [address] [place] [address] [phone]  [facility] [address] [place] [address] [phone]  [facility] [address] [place] [address] [phone]  [facility] [address] [place] [address] [phone]  [facility] [address] [place], [place] [address] [phone]  [facility] [address] [place] [address] [phone]  [facility] [address] [place], [place] [address] [phone]  [facility] [address] [place] [address] [phone]  [facility] [address] [place] [address] [phone]  [facility] [address] [place] [address] [phone]  [facility] [address] [place] [address] [phone]  [facility] [address], [place] [address] [phone]  [facility] [address] [place] [address] [phone]  [facility] [place] [phone]  [facility] [address] [place] [address] [phone]   [facility] EFAX [address] [place], [place] [phone]  [facility] [address], [place] [address] [phone]  [facility] [address], [place] [address] [phone]  [facility] [address], [place] [address] [phone]  [facility] [address] [place], [place] [address] [phone]  [facility] [address], [place] [address] [phone]  [facility] Inpatient [place] [phone]  [facility] Pl[facility], [place] [phone]  [facility] [address] [place] [address] [phone]  [facility] [address] [place] [address] [phone]  [facility] [address], [place] [address] [phone]    Situation ongoing,  CSW will follow up.    Maryjean Ka, MSW, [facility] 01/29/2024 4:24 PM
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
## 2024-01-29 NOTE — ED Provider Notes (Signed)
Emergency Medicine Observation Re-evaluation Note  Diamond Mccoy is a 43 y.o. female, seen on rounds today.  Pt initially presented to the ED for complaints of Psychiatric Evaluation Currently, the patient is resting.  Physical Exam  BP (!) 144/99 (BP Location: Left Arm)   Pulse (!) 133 Comment: pt was jumping and moving around during vitals  Temp 99 F (37.2 C) (Oral)   Resp 20   Ht 1.6 m (5\' 3" )   Wt 90 kg   LMP 06/11/2015 (Exact Date)   SpO2 94%   BMI 35.15 kg/m  Physical Exam General: calm, nad   ED Course / MDM  EKG:EKG Interpretation Date/Time:  Saturday January 28 2024 00:18:57 EST Ventricular Rate:  60 PR Interval:  158 QRS Duration:  82 QT Interval:  434 QTC Calculation: 434 R Axis:   75  Text Interpretation: Normal sinus rhythm Normal ECG When compared with ECG of 07-Nov-2022 07:34, PREVIOUS ECG IS PRESENT Confirmed by Alvester Chou [phone]) on 01/28/2024 7:02:05 AM  I have reviewed the labs performed to date as well as medications administered while in observation.  Recent changes in the last 24 hours include evaluation by psychiatry.  Plan  Current plan is for inpt psych hospitalization. Pt medically cleared.  HR documented at 133 this morning however pt was jumping and moving.   Pulse not accurate.  Will repeat    Linwood Dibbles, MD 01/29/24 [phone]

## 2024-01-29 NOTE — ED Provider Notes (Signed)
Emergency Medicine Observation Re-evaluation Note  Diamond Mccoy is a 43 y.o. female, seen on rounds today.  Pt initially presented to the ED for complaints of Psychiatric Evaluation Currently, the patient is resting.  Physical Exam  BP (!) 144/99 (BP Location: Left Arm)   Pulse (!) 133 Comment: pt was jumping and moving around during vitals  Temp 99 F (37.2 C) (Oral)   Resp 20   Ht 1.6 m (5\' 3" )   Wt 90 kg   LMP 06/11/2015 (Exact Date)   SpO2 94%   BMI 35.15 kg/m  Physical Exam General: nad   ED Course / MDM  EKG:EKG Interpretation Date/Time:  Saturday January 28 2024 00:18:57 EST Ventricular Rate:  60 PR Interval:  158 QRS Duration:  82 QT Interval:  434 QTC Calculation: 434 R Axis:   75  Text Interpretation: Normal sinus rhythm Normal ECG When compared with ECG of 07-Nov-2022 07:34, PREVIOUS ECG IS PRESENT Confirmed by Alvester Chou [phone]) on 01/28/2024 7:02:05 AM  I have reviewed the labs performed to date as well as medications administered while in observation.  Recent changes in the last 24 hours include pt has been cooperative.  No behavioral events.  Plan  Current plan is for inpt tx.    Linwood Dibbles, MD 01/29/24 [phone]

## 2024-01-29 NOTE — ED Notes (Signed)
Pt is asleep in bed with equal rise and fall. Pt was not bothered due to aggressive behavior that required meds.

## 2024-01-29 NOTE — Progress Notes (Signed)
Inpatient Behavioral Health Placement-[facility] follow-up:   CSW completed verbal for [facility] referral that was sent by Damita Dunnings, LCSW on 01/28/2024.  PATIENT IS NOT ON THE [facility] WAITLIST. PATIENT IS UNDER REVIEW.  CSW/Disposition team will assist and follow up with [facility] placement.  Maryjean Ka, MSW, [facility] 01/29/2024 4:41 PM

## 2024-01-30 DIAGNOSIS — F259 Schizoaffective disorder, unspecified: Secondary | ICD-10-CM | POA: Diagnosis not present

## 2024-01-30 MED ORDER — OLANZAPINE 5 MG PO TBDP
5.0000 mg | ORAL_TABLET | Freq: Two times a day (BID) | ORAL | Status: DC
  Administered 2024-01-30: 5 mg via ORAL
  Filled 2024-01-30: qty 1

## 2024-01-30 MED ORDER — DIVALPROEX SODIUM 125 MG PO CSDR
250.0000 mg | DELAYED_RELEASE_CAPSULE | Freq: Three times a day (TID) | ORAL | Status: DC
Start: 2024-01-30 — End: 2024-02-01
  Administered 2024-01-30 – 2024-01-31 (×2): 250 mg via ORAL
  Filled 2024-01-30 (×5): qty 2

## 2024-01-30 MED ORDER — RISPERIDONE 0.5 MG PO TBDP
1.0000 mg | ORAL_TABLET | Freq: Two times a day (BID) | ORAL | Status: DC
  Administered 2024-01-30 – 2024-01-31 (×2): 1 mg via ORAL
  Filled 2024-01-30 (×4): qty 2

## 2024-01-30 MED ORDER — RISPERIDONE 0.5 MG PO TBDP: 2.0000 mg | ORAL_TABLET | Freq: Three times a day (TID) | ORAL | Status: DC | PRN

## 2024-01-30 NOTE — ED Provider Notes (Addendum)
Emergency Medicine Observation Re-evaluation Note  Diamond Mccoy is a 43 y.o. female, seen on rounds today.  Pt initially presented to the ED for complaints of Psychiatric Evaluation Currently, the patient is awaiting psychiatric placement.  Physical Exam  BP 111/83 (BP Location: Left Arm)   Pulse (!) 102   Temp 98.9 F (37.2 C) (Oral)   Resp (!) 21   Ht 5\' 3"  (1.6 m)   Wt 90 kg   LMP 06/11/2015 (Exact Date)   SpO2 97%   BMI 35.15 kg/m  Physical Exam Alert and in no acute distress ED Course / MDM  EKG:EKG Interpretation Date/Time:  Saturday January 28 2024 00:18:57 EST Ventricular Rate:  60 PR Interval:  158 QRS Duration:  82 QT Interval:  434 QTC Calculation: 434 R Axis:   75  Text Interpretation: Normal sinus rhythm Normal ECG When compared with ECG of 07-Nov-2022 07:34, PREVIOUS ECG IS PRESENT Confirmed by Alvester Chou [phone]) on 01/28/2024 7:02:05 AM  I have reviewed the labs performed to date as well as medications administered while in observation.  Recent changes in the last 24 hours include none.  Plan  Current plan is for psychiatric placement.    Bethann Berkshire, MD 01/30/24 [phone]    Bethann Berkshire, MD 01/30/24 [phone]

## 2024-01-30 NOTE — Consult Note (Signed)
[facility] Psychiatric Consult Follow-up  Patient Name: .Diamond Mccoy  MRN: [id number]  DOB: 05/19/1981  Consult Order details:  Orders (From admission, onward)     Start     Ordered   01/28/24 0004  CONSULT TO CALL ACT TEAM       Ordering Provider: Darrick Grinder, PA-C  Provider:  (Not yet assigned)  Question:  Reason for Consult?  Answer:  Psych consult   01/28/24 0004             Mode of Visit: In person    Psychiatry Consult Evaluation  Service Date: January 30, 2024 LOS:  LOS: 0 days  Chief Complaint aggressive, bizarre behavior   Primary Psychiatric Diagnoses  Psychosis, unspecified  Paranoia  Aggression  Assessment  Diamond Mccoy is a 43 y.o. female admitted: Presented to the EDfor 01/27/2024  6:45 PM for psychosis. She carries the psychiatric diagnoses of PTSD,  and has a past medical history of hyperactive.   Patient is seen for psych consultation due to psychosis, bizarre behavior and agitation.  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  She then began singing, stating "I do not like I can sing do I, you did not expect this for me "patient then begins singing several songs.  When the provider asked this patient where she lived, she stated that she was single, patient is a poor historian as it is difficult to get answers, as patient becomes very  distracted and off topic. Patient does seem to display paranoid behavior.  Patient continues to deny alcohol use, denies smoking, denies illicit drug use at this time. On initial examination, patient alert, and oriented to person and place, pleasant does appear to be influence by internal stimuli. Please see plan below for detailed recommendations.     Diagnoses:  Active Hospital problems: Principal Problem:   Psychosis (HCC) Active Problems:   Aggression aggravated   Paranoia (HCC)    Plan   ## Psychiatric Medication Recommendations:  Start Risperdal 1 mg twice daily for mood and psychosis Start Depakote 250 mg 3 times daily for mood   ## Medical Decision Making Capacity: Not specifically addressed in this encounter   ## Further Work-up:  -- Will monitor the patient's LFTs and valproic evels UDS ---- Pertinent labwork reviewed earlier this admission includes: see chart     ## Disposition:-- We recommend inpatient psychiatric hospitalization when medically cleared. Patient is under voluntary admission status at this time; please IVC if attempts to leave hospital.   ## Behavioral / Environmental: - No specific recommendations at this time.                 ## Safety and Observation Level:  - Based on my clinical evaluation, I estimate the patient to be at low risk of self harm in the current setting. - At this  time, we recommend  1:1 Observation. This decision is based on my review of the chart including patient's history and current presentation, interview of the patient, mental status examination, and consideration of suicide risk including evaluating suicidal ideation, plan, intent, suicidal or self-harm behaviors, risk factors, and protective factors. This judgment is based on our ability to directly address suicide risk, implement suicide prevention strategies, and develop a safety plan while the patient is in the clinical setting. Please contact our team if there is a concern  that risk level has changed.   CSSR Risk Category:C-SSRS RISK CATEGORY: No Risk   Suicide Risk Assessment: Patient has following modifiable risk factors for suicide: untreated depression, social isolation, and recklessness, which we are addressing by psych. Patient has following non-modifiable or demographic risk factors for suicide: psychiatric hospitalization Patient has the following protective factors against suicide: Supportive family and Minor children in the home   Thank you for this consult request. Recommendations have been communicated to the primary team.  We will recommend inpatient admission at this time  Alona Bene, PMHNP       History of Present Illness  Relevant Aspects of Hospital ED Course:  Admitted on 01/27/2024 for psychosis, and aggressive, bizarre behavior.  Patient Report:  Patient is seen for psych consultation due to psychosis, bizarre behavior and agitation.  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  She then began singing, stating "I do not like I can sing do I, you did not expect this for me "patient then begins singing several songs.  When the provider asked this patient where she lived, she stated that she was single, patient is a poor historian as it is difficult to get answers, as patient becomes very distracted and off topic. Patient  does seem to display paranoid behavior.  Patient continues to deny alcohol use, denies smoking, denies illicit drug use at this time. Patient alert, and oriented to person and place, pleasant does appear to be influence by internal stimuli.    Psych ROS:  Depression: Patient denies Anxiety:  Denies Mania (lifetime and current): Yes Psychosis: (lifetime and current): Yes  Collateral information:  Contacted patient spouse Altair Stanko, no answer will attempt again   Review of Systems  Psychiatric/Behavioral:  Positive for hallucinations.        Delusional and paranoid     Psychiatric and Social History  Psychiatric History:  Information collected from chart review  Prev Dx/Sx: Bipolar disorder and paranoia Current Psych Provider: None Home Meds (current): See above Previous Med Trials: Unknown Therapy: None  Prior Psych Hospitalization: Yes Prior Self Harm: Unknown Prior Violence: Yes  Family Psych History: Unknown Family Hx suicide: Unknown  Social History:  Developmental Hx: Unknown Educational Hx: Patient graduated high school Occupational Hx: Unemployed Legal Hx: Yes Living Situation: Lives with spouse Spiritual Hx: Unknown Access to weapons/lethal means: Unknown  Substance History Alcohol: No, patient states she has not had a drink in years Type of alcohol N/A Last Drink N/A Number of drinks per day N/A History of alcohol withdrawal seizures N/A Tobacco: Yes Illicit drugs: Yes Prescription drug abuse: Denies Rehab hx: Unknown  Exam Findings  Physical Exam:  Vital Signs:  Temp:  [98.7 F (37.1 C)] 98.7 F (37.1 C) (02/03 1615) Pulse Rate:  [107] 107 (02/03 1615) Resp:  [18] 18 (02/03 1615) BP: (127)/(87) 127/87 (02/03 1615) SpO2:  [100 %] 100 % (02/03 1615) Blood pressure 127/87, pulse (!) 107, temperature 98.7 F (37.1 C), temperature source Oral, resp. rate 18, height 5\' 3"  (1.6 m), weight 90 kg, last menstrual period 06/11/2015, SpO2 100%. Body  mass index is 35.15 kg/m.  Physical Exam Vitals and nursing note reviewed. Exam conducted with a chaperone present.  Neurological:     Mental Status: She is alert.  Psychiatric:        Attention and Perception: Attention normal. She perceives auditory hallucinations.        Mood and Affect: Mood is elated.        Speech: Speech normal.        Behavior: Behavior is cooperative.        Thought Content: Thought content is paranoid and delusional.        Cognition and Memory: Cognition is impaired.        Judgment: Judgment is impulsive and inappropriate.     Mental Status Exam: General Appearance: Bizarre  Orientation:  Full (Time, Place, and Person)  Memory:  Immediate;   Fair Remote;   Fair  Concentration:  Concentration: Fair and Attention Span: Fair  Recall:  Fair  Attention  Other: poor  Eye Contact:  Poor  Speech:  Clear and Coherent  Language:  Fair  Volume:  Normal  Mood: excited  Affect:  Labile  Thought Process:  Disorganized  Thought Content:  Delusions and Paranoid Ideation  Suicidal Thoughts:  No  Homicidal Thoughts:  No  Judgement:  Impaired  Insight:  Lacking  Psychomotor Activity:  Normal  Akathisia:  No  Fund of Knowledge:  Fair      Assets:  Manufacturing systems engineer Desire for Improvement Intimacy Social Support  Cognition:  Impaired,  Mild  ADL's:  Intact  AIMS (if indicated):        Other History   These have been pulled in through the EMR, reviewed, and updated if appropriate.  Family History:  The patient's family history includes Breast cancer in her cousin, maternal grandmother, and paternal grandmother; Cardiomyopathy in her maternal aunt, maternal uncle, and mother; Cardiomyopathy (age of onset: 19) in her half-brother; Colon cancer in her maternal uncle; Prostate cancer in her maternal uncle and paternal grandfather.  Medical History: Past Medical History:  Diagnosis Date   Achilles tendon rupture 05/28/2015   right   Dental crowns  present    Family history of breast cancer    Family history of prostate cancer    Uterine cancer [facility])     Surgical History: Past Surgical History:  Procedure Laterality Date   ACHILLES TENDON SURGERY Right 06/19/2015   Procedure: RIGHT ACHILLES TENDON REPAIR;  Surgeon: Toni Arthurs, MD;  Location: [facility];  Service: Orthopedics;  Laterality: Right;   LEEP     TONSILLECTOMY AND ADENOIDECTOMY     UNILATERAL SALPINGECTOMY Left      Medications:   Current Facility-Administered Medications:    LORazepam (ATIVAN) tablet 2 mg, 2 mg,  Oral, Q6H PRN **OR** LORazepam (ATIVAN) injection 2 mg, 2 mg, Intramuscular, Q6H PRN, Sindy Guadeloupe, NP   OLANZapine zydis (ZYPREXA) disintegrating tablet 5 mg, 5 mg, Oral, BID, Motley-Mangrum, Pama Roskos A, PMHNP, 5 mg at 01/30/24 1145   risperiDONE (RISPERDAL M-TABS) disintegrating tablet 2 mg, 2 mg, Oral, TID PRN, Motley-Mangrum, Clarissia Mckeen A, PMHNP  Current Outpatient Medications:    aspirin EC 325 MG tablet, Take 1 tablet (325 mg total) by mouth daily. (Patient not taking: Reported on 01/30/2024), Disp: 42 tablet, Rfl: 0   docusate sodium (COLACE) 100 MG capsule, Take 1 capsule (100 mg total) by mouth 2 (two) times daily. While taking narcotic pain medicine. (Patient not taking: Reported on 01/30/2024), Disp: 30 capsule, Rfl: 0   LORazepam (ATIVAN) 1 MG tablet, Take 1 tablet (1 mg total) by mouth 3 (three) times daily as needed for anxiety. (Patient not taking: Reported on 01/30/2024), Disp: 15 tablet, Rfl: 0   oxyCODONE (ROXICODONE) 5 MG immediate release tablet, Take 1-2 tablets (5-10 mg total) by mouth every 4 (four) hours as needed for moderate pain or severe pain. (Patient not taking: Reported on 01/30/2024), Disp: 30 tablet, Rfl: 0   senna (SENOKOT) 8.6 MG TABS tablet, Take 2 tablets (17.2 mg total) by mouth 2 (two) times daily. (Patient not taking: Reported on 01/30/2024), Disp: 30 each, Rfl: 0  Allergies: Allergies  Allergen Reactions   Shrimp  Extract Anaphylaxis and Other (See Comments)    Listed as "NKA," however, with the [facility].    Alona Bene, PMHNP

## 2024-01-30 NOTE — ED Notes (Signed)
Patient still sleeping

## 2024-01-30 NOTE — ED Notes (Signed)
Patient took a shower.

## 2024-01-30 NOTE — ED Notes (Signed)
Patient has been alert and oriented this shift. Patient has been cooperative and redirectable. No aggression noted. Patient medication compliant this shift. Patient mood and effect does seem incongruent at times, overly happy at times. No suicidal ideation noted. No homicidal ideation noted.

## 2024-01-30 NOTE — Progress Notes (Signed)
PLEASE DO NOT TELL PATIENT SHE IS ON THE [facility] WAIT-LIST TO PREVENT ANY CONFLICTS.    CSW has spoke to [facility] Intake, at this time the patient is OFFICIALLY on the [facility] wait-list please do not discuss DC plans with the patient at this time. Wait-list is currently 3 to 6 months long. [facility] will update daily on bed availability.   [place] Diamond [place] LCSW-A   01/30/2024 1:45 PM

## 2024-01-31 DIAGNOSIS — F259 Schizoaffective disorder, unspecified: Secondary | ICD-10-CM

## 2024-01-31 LAB — SARS CORONAVIRUS 2 BY RT PCR: SARS Coronavirus 2 by RT PCR: NEGATIVE

## 2024-01-31 MED ORDER — CETAPHIL MOISTURIZING EX LOTN
TOPICAL_LOTION | Freq: Every day | CUTANEOUS | Status: DC
  Filled 2024-01-31: qty 473

## 2024-01-31 NOTE — ED Notes (Signed)
Per [employer], upon discharge please notify [employer] to serve a warrant for her arrest.
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
## 2024-01-31 NOTE — ED Notes (Signed)
 Patient has been doing well today. The patient in room 37 has been picking on her quite a bit but instead of resorting to violence patient has been coming to the window to tell staff what is going on. She Said she is trying to do better and be nicer to everyone.

## 2024-01-31 NOTE — ED Provider Notes (Signed)
 Emergency Medicine Observation Re-evaluation Note  Diamond Mccoy is a 43 y.o. female, seen on rounds today.  Pt initially presented to the ED for complaints of Psychiatric Evaluation Currently, the patient is resting.  Physical Exam  BP 105/74 (BP Location: Left Arm)   Pulse 72   Temp 98.2 F (36.8 C) (Oral)   Resp 16   Ht 5' 3 (1.6 m)   Wt 90 kg   LMP 06/11/2015 (Exact Date)   SpO2 100%   BMI 35.15 kg/m  Physical Exam General: awake Cardiac: regular rate Lungs: no distress Psych: calm  ED Course / MDM  EKG:EKG Interpretation Date/Time:  Saturday January 28 2024 00:18:57 EST Ventricular Rate:  60 PR Interval:  158 QRS Duration:  82 QT Interval:  434 QTC Calculation: 434 R Axis:   75  Text Interpretation: Normal sinus rhythm Normal ECG When compared with ECG of 07-Nov-2022 07:34, PREVIOUS ECG IS PRESENT Confirmed by Cottie Cough [phone]) on 01/28/2024 7:02:05 AM  I have reviewed the labs performed to date as well as medications administered while in observation.  Recent changes in the last 24 hours include briefly agitated requiring IM.  Plan  Current plan is for psychiatric placement.    Francesca Elsie CROME, MD 01/31/24 [phone]

## 2024-01-31 NOTE — ED Notes (Signed)
Patient refused the medications ordered and stated that she normally takes the following medications:  Cymbalta 40 mg PO daily, wellbutrin 300mg  PO daily and norvasc 5mg  PO daily.

## 2024-01-31 NOTE — ED Notes (Addendum)
 Patient wanted to take a shower. We stated that she could if she took her medication. Brought it to her and she first stated that I didn't have the right medication and then she pretended to take them. I stated that she didn't take them and to show me her hands. She put some in her pocket and them placed some in her mouth and pretended to swallow them. She then proceeded to spit up and 2 pills were seen on the floor. I properly disposed of the 2 pills on the floor. I asked for her to show me her mouth. I am uncertain whether she swallowed any of the six pills. After supposedly swallowing some of them she proceeded to the bathroom and flushed the toilet approximately 10 times. Will continue to monitor.   Patient ended up not taking another shower.

## 2024-01-31 NOTE — ED Notes (Signed)
Patient refused medication.   States, no doctor has talked to her about her medication and wants to talk to provider.

## 2024-01-31 NOTE — ED Notes (Signed)
Patient very agitated in the room. Screaming and activating code button few times. PRN ativan IM Given. Patient aggreed to take her night medicine too. Will continue to monitor.

## 2024-01-31 NOTE — ED Notes (Signed)
Patient continues to refuse medication. Mood and affect incongruent at times. Paranoia noted . No suicidal or homicidal ideation noted. No aggression noted.

## 2024-02-01 MED ORDER — HALOPERIDOL LACTATE 5 MG/ML IJ SOLN
5.0000 mg | INTRAMUSCULAR | Status: DC
Start: 2024-02-01 — End: 2024-02-01

## 2024-02-01 MED ORDER — HALOPERIDOL LACTATE 5 MG/ML IJ SOLN
INTRAMUSCULAR | Status: AC
  Filled 2024-02-01: qty 1

## 2024-02-01 MED ORDER — HALOPERIDOL LACTATE 5 MG/ML IJ SOLN
5.0000 mg | Freq: Once | INTRAMUSCULAR | Status: AC
  Administered 2024-02-01: 5 mg via INTRAMUSCULAR

## 2024-02-01 NOTE — ED Notes (Signed)
VSs will be taken when patient wakes up.

## 2024-02-01 NOTE — ED Notes (Signed)
Transport contacted at this time.

## 2024-02-01 NOTE — ED Notes (Addendum)
 Almost positive patient flushed her medications down the toilet and she started escalating and being aggressive. She also flooded the bathroom and stated, Good luck with that!  Notified Dr. Carita. New order for IM haldol  ordered and given with IM ativan . Security and [employer] present.  0100 - Went to clean the BR where she had overflowed the toilet. Toilet clogged. Had to call facilities to fix the clogged toilet.

## 2024-02-01 NOTE — ED Notes (Signed)
 PD called back, states they will be here to transport pt in 10 minutes, entered pt room, pt eating in bed, made pt aware of her going to [facility] inpatient psychiatric care pt stated [name] is the one who needs to be inpatient not me, I want to speak to the [facility] made pt aware unable to allow her to speak to [facility]. Pt requested to speak to mother, pt at desk speaking to mother on phone

## 2024-02-01 NOTE — ED Notes (Signed)
 [facility] called pts mother Starletta Houchin) to notify her of pts disposition and provide the phone number for the [facility].   Patt Boozer, [facility]  02/01/24

## 2024-02-01 NOTE — Progress Notes (Addendum)
 Pt has been accepted to [facility] 02/01/2024 Building 8 First Floor Room 116   Pt meets inpatient criteria per: [name], PMHNP   Attending Physician will be: Thresa Prudent MD   Report can be called to: [phone] [phone]  Pt can arrive ASAP   Care Team Notified: Efrain Patient NP, Chesley Holt NT, Taylour Nape RN.  [place] Aurther Harlin LCSW-A   02/01/2024 1:02 PM

## 2024-02-01 NOTE — ED Provider Notes (Signed)
 Patient has been updated and transferred to the [place].  She is under IVC.   Jerilynn Montenegro, MD 02/01/24 [phone]

## 2024-02-19 ENCOUNTER — Other Ambulatory Visit: Payer: Self-pay

## 2024-02-19 ENCOUNTER — Emergency Department (HOSPITAL_COMMUNITY)
Admission: EM | Admit: 2024-02-19 | Discharge: 2024-02-22 | Disposition: A | Discharge status: Other Acute Inpatient Hospital | Payer: No Typology Code available for payment source | Attending: Emergency Medicine | Admitting: Emergency Medicine

## 2024-02-19 ENCOUNTER — Encounter (HOSPITAL_COMMUNITY): Payer: Self-pay | Admitting: Emergency Medicine

## 2024-02-19 DIAGNOSIS — F1220 Cannabis dependence, uncomplicated: Secondary | ICD-10-CM | POA: Diagnosis not present

## 2024-02-19 DIAGNOSIS — D649 Anemia, unspecified: Secondary | ICD-10-CM | POA: Diagnosis not present

## 2024-02-19 DIAGNOSIS — R4689 Other symptoms and signs involving appearance and behavior: Secondary | ICD-10-CM | POA: Diagnosis not present

## 2024-02-19 DIAGNOSIS — Z008 Encounter for other general examination: Secondary | ICD-10-CM

## 2024-02-19 DIAGNOSIS — F29 Unspecified psychosis not due to a substance or known physiological condition: Secondary | ICD-10-CM | POA: Diagnosis not present

## 2024-02-19 DIAGNOSIS — F22 Delusional disorders: Secondary | ICD-10-CM | POA: Insufficient documentation

## 2024-02-19 DIAGNOSIS — R456 Violent behavior: Secondary | ICD-10-CM | POA: Diagnosis present

## 2024-02-19 DIAGNOSIS — F1290 Cannabis use, unspecified, uncomplicated: Secondary | ICD-10-CM | POA: Insufficient documentation

## 2024-02-19 LAB — CBC WITH DIFFERENTIAL/PLATELET
Abs Immature Granulocytes: 0.01 10*3/uL (ref 0.00–0.07)
Basophils Absolute: 0 10*3/uL (ref 0.0–0.1)
Basophils Relative: 1 %
Eosinophils Absolute: 0 10*3/uL (ref 0.0–0.5)
Eosinophils Relative: 0 %
HCT: 36.8 % (ref 36.0–46.0)
Hemoglobin: 11.7 g/dL — ABNORMAL LOW (ref 12.0–15.0)
Immature Granulocytes: 0 %
Lymphocytes Relative: 39 %
Lymphs Abs: 1.9 10*3/uL (ref 0.7–4.0)
MCH: 27 pg (ref 26.0–34.0)
MCHC: 31.8 g/dL (ref 30.0–36.0)
MCV: 84.8 fL (ref 80.0–100.0)
Monocytes Absolute: 0.6 10*3/uL (ref 0.1–1.0)
Monocytes Relative: 12 %
Neutro Abs: 2.4 10*3/uL (ref 1.7–7.7)
Neutrophils Relative %: 48 %
Platelets: 244 10*3/uL (ref 150–400)
RBC: 4.34 MIL/uL (ref 3.87–5.11)
RDW: 15.8 % — ABNORMAL HIGH (ref 11.5–15.5)
WBC: 4.8 10*3/uL (ref 4.0–10.5)
nRBC: 0 % (ref 0.0–0.2)

## 2024-02-19 LAB — COMPREHENSIVE METABOLIC PANEL
ALT: 27 U/L (ref 0–44)
AST: 39 U/L (ref 15–41)
Albumin: 4.7 g/dL (ref 3.5–5.0)
Alkaline Phosphatase: 43 U/L (ref 38–126)
Anion gap: 15 (ref 5–15)
BUN: 20 mg/dL (ref 6–20)
CO2: 19 mmol/L — ABNORMAL LOW (ref 22–32)
Calcium: 9.6 mg/dL (ref 8.9–10.3)
Chloride: 107 mmol/L (ref 98–111)
Creatinine, Ser: 0.95 mg/dL (ref 0.44–1.00)
GFR, Estimated: >60 mL/min (ref >60)
Glucose, Bld: 83 mg/dL (ref 70–99)
Potassium: 4.2 mmol/L (ref 3.5–5.1)
Sodium: 141 mmol/L (ref 135–145)
Total Bilirubin: 1.1 mg/dL (ref 0.0–1.2)
Total Protein: 7.6 g/dL (ref 6.5–8.1)

## 2024-02-19 LAB — RAPID URINE DRUG SCREEN, HOSP PERFORMED
Amphetamines: NOT DETECTED
Barbiturates: NOT DETECTED
Benzodiazepines: NOT DETECTED
Cocaine: NOT DETECTED
Opiates: NOT DETECTED
Tetrahydrocannabinol: POSITIVE — AB

## 2024-02-19 LAB — SALICYLATE LEVEL: Salicylate Lvl: <7 mg/dL — ABNORMAL LOW (ref 7.0–30.0)

## 2024-02-19 LAB — ETHANOL: Alcohol, Ethyl (B): <10 mg/dL (ref <10)

## 2024-02-19 LAB — ACETAMINOPHEN LEVEL: Acetaminophen (Tylenol), Serum: <10 ug/mL — ABNORMAL LOW (ref 10–30)

## 2024-02-19 MED ORDER — LORAZEPAM 2 MG/ML IJ SOLN
0.0000 mg | Freq: Four times a day (QID) | INTRAMUSCULAR | Status: DC
  Filled 2024-02-19: qty 1

## 2024-02-19 MED ORDER — THIAMINE MONONITRATE 100 MG PO TABS
100.0000 mg | ORAL_TABLET | Freq: Every day | ORAL | Status: DC
  Administered 2024-02-20 – 2024-02-22 (×2): 100 mg via ORAL
  Filled 2024-02-19 (×3): qty 1

## 2024-02-19 MED ORDER — OLANZAPINE 5 MG PO TABS
5.0000 mg | ORAL_TABLET | Freq: Four times a day (QID) | ORAL | Status: DC
Start: 2024-02-19 — End: 2024-02-20
  Administered 2024-02-19 – 2024-02-20 (×2): 5 mg via ORAL
  Filled 2024-02-19 (×2): qty 1

## 2024-02-19 MED ORDER — LORAZEPAM 2 MG/ML IJ SOLN: 0.0000 mg | Freq: Two times a day (BID) | INTRAMUSCULAR | Status: DC

## 2024-02-19 MED ORDER — THIAMINE HCL 100 MG/ML IJ SOLN: 100.0000 mg | Freq: Every day | INTRAMUSCULAR | Status: DC

## 2024-02-19 MED ORDER — LORAZEPAM 1 MG PO TABS
0.0000 mg | ORAL_TABLET | Freq: Four times a day (QID) | ORAL | Status: DC
  Administered 2024-02-19: 1 mg via ORAL
  Filled 2024-02-19 (×2): qty 1

## 2024-02-19 MED ORDER — LORAZEPAM 2 MG/ML IJ SOLN
2.0000 mg | Freq: Four times a day (QID) | INTRAMUSCULAR | Status: DC | PRN
Start: 2024-02-19 — End: 2024-02-21

## 2024-02-19 MED ORDER — LORAZEPAM 1 MG PO TABS: 0.0000 mg | ORAL_TABLET | Freq: Two times a day (BID) | ORAL | Status: DC
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
## 2024-02-19 NOTE — BH Assessment (Signed)
 TTS Consult to be completed by [facility]. [facility] Coordinator will communicate in established secure chat assessment time and name of provider. Thanks

## 2024-02-19 NOTE — ED Notes (Signed)
 Pt changed out into purple scrubs with the assistance from [employer]. Pt belongings have been itemized and placed in purple locker #3. BH packet with primary nurse and IVC paperwork with blue Diplomatic Services operational officer.

## 2024-02-19 NOTE — ED Provider Triage Note (Signed)
 Emergency Medicine Provider Triage Evaluation Note  Diamond Mccoy , a 43 y.o. female  was evaluated in triage.  Pt complains of IVC.  Review of Systems  Positive:  Negative:   Physical Exam  LMP 06/11/2015 (Exact Date)  Gen:   Awake, no distress   Resp:  Normal effort  MSK:   Moves extremities without difficulty  Other:    Medical Decision Making  Medically screening exam initiated at 6:56 PM.  Appropriate orders placed.  Diamond Mccoy was informed that the remainder of the evaluation will be completed by another provider, this initial triage assessment does not replace that evaluation, and the importance of remaining in the ED until their evaluation is complete.  Patient IVC'd by parents. Has been hallucinating and abusing drugs. Patient combative with police. Apparently patient broke a lightbulb and attempted to eat it.   Dorthy Cooler, [place] 02/19/24 1859
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
## 2024-02-20 MED ORDER — OLANZAPINE 5 MG PO TABS
5.0000 mg | ORAL_TABLET | Freq: Two times a day (BID) | ORAL | Status: DC
  Administered 2024-02-20 – 2024-02-22 (×3): 5 mg via ORAL
  Filled 2024-02-20 (×4): qty 1

## 2024-02-20 NOTE — ED Notes (Signed)
Pt ambulated to restroom independently with normal gait.

## 2024-02-20 NOTE — ED Notes (Signed)
 Envelope #[id number] has been submitted successfully.

## 2024-02-20 NOTE — Progress Notes (Signed)
 Pt has been accepted to [facility] TOMORROW 02/21/2024 Bed assignment: Main campus  Pt meets inpatient criteria per: Ophelia Shoulder NP  Attending Physician will be Loni Beckwith, MD  Report can be called to: [phone] (this is a pager, please leave call-back number when giving report)  Pt can arrive after 8 AM  Care Team Notified: Ophelia Shoulder NP,  [facility] RN  [place] LCSW-A   02/20/2024 10:54 AM

## 2024-02-20 NOTE — Progress Notes (Signed)
 LCSW Progress Note  [id number]   Diamond Mccoy  02/20/2024  10:21 AM  Description:   Inpatient Psychiatric Referral  Patient was recommended inpatient per Ophelia Shoulder NP). There are no available beds at [facility], per [facility] Rona Ravens RN.Patient was referred to the following out of network facilities:    Destination  Service Provider Address Phone Fax  [facility], [place] [phone]  [facility] [address] [place] [address] [phone]  [facility] [address] [place] [address] [phone]  [facility] [address] [place], [place] [address] [phone]  [facility] [address] [place] [address] [phone]  [facility] [address] [place] [address] [phone]  [facility] [address], [place] [address] [phone]  [facility] [address], [place] [address] [phone]  [facility] [address] [place] [address] [phone]  [facility] [address] [place] [address] [phone]  [facility] [place] [phone]  [facility] EFAX [address], [place] [phone]  [facility] [address] [place] [address] [phone]  [facility] [address] [place] [address] [phone]  [facility] [address] [place], [place] [address] [phone]      Situation ongoing, CSW to continue following and update chart as more  information becomes available.      [place] Lavada Langsam, MSW, LCSW  02/20/2024 10:21 AM

## 2024-02-20 NOTE — ED Notes (Signed)
 IVC paperwork scanned in pt chart, pt was brought in by police already Ivc. Original copy in red folder . 3 copies in purple

## 2024-02-21 MED ORDER — LORAZEPAM 1 MG PO TABS
1.0000 mg | ORAL_TABLET | Freq: Four times a day (QID) | ORAL | Status: DC | PRN
  Administered 2024-02-21 – 2024-02-22 (×2): 1 mg via ORAL
  Filled 2024-02-21 (×2): qty 1

## 2024-02-21 MED ORDER — OLANZAPINE 10 MG IM SOLR
5.0000 mg | Freq: Once | INTRAMUSCULAR | Status: AC
  Administered 2024-02-21: 5 mg via INTRAMUSCULAR
  Filled 2024-02-21: qty 10

## 2024-02-21 MED ORDER — LORAZEPAM 2 MG/ML IJ SOLN
1.0000 mg | Freq: Once | INTRAMUSCULAR | Status: AC
  Administered 2024-02-21: 1 mg via INTRAMUSCULAR

## 2024-02-21 MED ORDER — STERILE WATER FOR INJECTION IJ SOLN
INTRAMUSCULAR | Status: AC
  Filled 2024-02-21: qty 10

## 2024-02-21 MED ORDER — LORAZEPAM 2 MG/ML IJ SOLN: 2.0000 mg | Freq: Four times a day (QID) | INTRAMUSCULAR | Status: DC | PRN

## 2024-02-21 MED ORDER — ACETAMINOPHEN 500 MG PO TABS
1000.0000 mg | ORAL_TABLET | Freq: Once | ORAL | Status: AC
  Administered 2024-02-21: 1000 mg via ORAL
  Filled 2024-02-21: qty 2

## 2024-02-21 NOTE — ED Provider Notes (Addendum)
 Emergency Medicine Observation Re-evaluation Note  Diamond Mccoy is a 43 y.o. female, seen on rounds today.  Pt initially presented to the ED for complaints of IVC Currently, the patient is accepted for treatment at [facility].  Patient reports she does not really have any complaints and that she feels good.  However she is reporting that the nursing staff is exacerbating her PTSD.  Denies any chest pain, shortness of breath, extremity swelling.  Reports feeling well.  Physical Exam  BP 129/78 (BP Location: Right Arm)   Pulse 86   Temp 98 F (36.7 C) (Oral)   Resp 18   Wt 90 kg   LMP 06/11/2015 (Exact Date)   SpO2 100%   BMI 35.15 kg/m  Physical Exam General: Alert and no respiratory distress clinically well in appearance. Cardiac: Regular no rub murmur gallop Lungs: Lear to auscultation Psych: Speech is pressured.  She is speaking very rapidly and changing topic.  ED Course / MDM  EKG:   I have reviewed the labs performed to date as well as medications administered while in observation.  Recent changes in the last 24 hours include accepted for inpatient treatment at [facility].  Plan  Current plan is for inpatient psychiatric treatment. EMTALA done. 09: 55 Sheriff was due to pick the patient up for transport within 10 minutes of EMTALA.  Sheriff's office subsequently called and advised they had no one to do transport and would not be available until tomorrow.  Patient has become agitated, she has refused her regular medications and at this time is pacing and leaving her room.  I have given verbal orders for IM Ativan and IM Zyprexa.  She has put her other medications down the toilet.   Arby Barrette, MD 02/21/24 5409    Arby Barrette, MD 02/21/24 [phone]

## 2024-02-21 NOTE — ED Notes (Signed)
 Pt calm, seems paranoid refused vital signs at this time.

## 2024-02-21 NOTE — ED Notes (Signed)
 Pt appears to be having active visual & auditory hallucinations at the present moment. Pt allowed me to obtain vital signs & do an assessment.

## 2024-02-21 NOTE — ED Notes (Signed)
 This RN Attempted to call report x 3 to [facility] with no answer, no response at this time.

## 2024-02-21 NOTE — ED Notes (Signed)
 Just assumed care of patient. Patient laying in bed watching TV. Patient is in no noted distress at present time will continue to monitor for any changes. Patient has been in and out the room about 3 times. Patient called and talked to her mom. Patient is visible from the nursing station.

## 2024-02-21 NOTE — ED Notes (Signed)
 Diamond Mccoy [phone]  (Mother) called for an update

## 2024-02-21 NOTE — ED Notes (Signed)
 Pt refusing to get back into room and or take her oral 1000 scheduled medications. She was handed medications by this RN then ran to bathroom and flushed them down toilet. Dr. Donnald Garre made aware, and verbal order placed for 1mg  Ativan IM, as the pt only has IV ativan or oral ativan ordered. Pt has no PIV. IM Weston Anna was also ordered.  Security called to bedside and both IM medications administered with security standing by.

## 2024-02-21 NOTE — ED Notes (Signed)
 Mother, Geisha Abernathy, requests to speak to her daughter before she is transferred.  Please have her call at [phone].

## 2024-02-22 MED ORDER — HYDROCORTISONE 1 % EX CREA: TOPICAL_CREAM | Freq: Two times a day (BID) | CUTANEOUS | Status: DC | PRN

## 2024-02-22 MED ORDER — ACETAMINOPHEN 500 MG PO TABS
1000.0000 mg | ORAL_TABLET | Freq: Once | ORAL | Status: AC
  Administered 2024-02-22: 1000 mg via ORAL
  Filled 2024-02-22: qty 2

## 2024-02-22 NOTE — ED Notes (Signed)
 IVC'd 02/19/24, exp 02/26/24; IVC docs in [place]
